# Patient Record
Sex: Male | Born: 1995 | Race: White | Hispanic: No | Marital: Single | Smoking: Never smoker
Health system: Southern US, Community
[De-identification: ages and names within clinical notes are randomized; demographics above are authoritative.]

## PROBLEM LIST (undated history)

## (undated) DIAGNOSIS — J3503 Chronic tonsillitis and adenoiditis: Secondary | ICD-10-CM

## (undated) HISTORY — PX: NO PAST SURGERIES: SHX2092

---
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
## 2017-05-22 HISTORY — PX: WISDOM TOOTH EXTRACTION: SHX21

## 2018-12-04 ENCOUNTER — Ambulatory Visit: Payer: BC Managed Care – PPO | Admitting: Podiatry

## 2018-12-09 ENCOUNTER — Ambulatory Visit (INDEPENDENT_AMBULATORY_CARE_PROVIDER_SITE_OTHER): Payer: BC Managed Care – PPO

## 2018-12-09 ENCOUNTER — Ambulatory Visit: Payer: BC Managed Care – PPO | Admitting: Podiatry

## 2018-12-09 ENCOUNTER — Other Ambulatory Visit: Payer: Self-pay

## 2018-12-09 ENCOUNTER — Encounter: Payer: Self-pay | Admitting: Podiatry

## 2018-12-09 VITALS — BP 115/63 | HR 72 | Temp 98.8°F | Resp 16

## 2018-12-09 DIAGNOSIS — M2142 Flat foot [pes planus] (acquired), left foot: Secondary | ICD-10-CM

## 2018-12-09 DIAGNOSIS — M2141 Flat foot [pes planus] (acquired), right foot: Secondary | ICD-10-CM

## 2018-12-10 ENCOUNTER — Encounter: Payer: Self-pay | Admitting: Podiatry

## 2018-12-10 NOTE — Progress Notes (Signed)
  Subjective:  Patient ID: Ricky Benson, male    DOB: 27-Dec-1995,  MRN: [id number] HPI Chief Complaint  Patient presents with  . Foot Pain    Patient states he feels like his feet roll inward when he walks sometimes causing pain  . New Patient (Initial Visit)    23 y.o. male presents with the above complaint.   ROS: Denies fever chills nausea vomiting muscle aches pains calf pain back pain chest pain shortness of breath.  Past Medical History:  Diagnosis Date  . Chronic tonsillitis and adenoiditis    Past Surgical History:  Procedure Laterality Date  . NO PAST SURGERIES    . TONSILLECTOMY AND ADENOIDECTOMY N/A 05/16/2016   Procedure: TONSILLECTOMY AND ADENOIDECTOMY;  Surgeon: [place] Gust, MD;  Location: [place];  Service: ENT;  Laterality: N/A;    Current Outpatient Medications:  .  DEXILANT 60 MG capsule, , Disp: , Rfl:  .  sucralfate (CARAFATE) 1 g tablet, , Disp: , Rfl:   No Known Allergies Review of Systems Objective:   Vitals:   12/09/18 1421  BP: 115/63  Pulse: 72  Resp: 16  Temp: 98.8 F (37.1 C)    General: Well developed, nourished, in no acute distress, alert and oriented x3   Dermatological: Skin is warm, dry and supple bilateral. Nails x 10 are well maintained; remaining integument appears unremarkable at this time. There are no open sores, no preulcerative lesions, no rash or signs of infection present.  Vascular: Dorsalis Pedis artery and Posterior Tibial artery pedal pulses are 2/4 bilateral with immedate capillary fill time. Pedal hair growth present. No varicosities and no lower extremity edema present bilateral.   Neruologic: Grossly intact via light touch bilateral. Vibratory intact via tuning fork bilateral. Protective threshold with Semmes Wienstein monofilament intact to all pedal sites bilateral. Patellar and Achilles deep tendon reflexes 2+ bilateral. No Babinski or clonus noted bilateral.   Musculoskeletal: No gross boney  pedal deformities bilateral. No pain, crepitus, or limitation noted with foot and ankle range of motion bilateral. Muscular strength 5/5 in all groups tested bilateral.  Flexible pes planus not severe in nature no posterior tibial tendon dysfunction as of yet. Gait: Unassisted, Nonantalgic.    Radiographs:  Radiographs taken today demonstrate no osseous coalitions mild pes planus.  Assessment & Plan:   Assessment: Pes planus  Plan: Scanned for orthotics.     Raigen Jagielski T. [place], [place]

## 2019-01-01 ENCOUNTER — Other Ambulatory Visit: Payer: BC Managed Care – PPO | Admitting: Orthotics

## 2019-01-01 ENCOUNTER — Telehealth: Payer: Self-pay | Admitting: Orthotics

## 2019-01-01 NOTE — Telephone Encounter (Signed)
Called to advised f/o not in.Marland Kitchen

## 2019-05-23 HISTORY — PX: OTHER SURGICAL HISTORY: SHX169

## 2019-10-01 ENCOUNTER — Other Ambulatory Visit: Payer: Self-pay | Admitting: General Surgery

## 2019-10-01 DIAGNOSIS — M6289 Other specified disorders of muscle: Secondary | ICD-10-CM

## 2019-10-01 NOTE — Progress Notes (Signed)
physci

## 2019-10-13 NOTE — Addendum Note (Signed)
Addended byDonnalee Curry on: 10/13/2019 03:08 PM   Modules accepted: Orders

## 2019-12-01 ENCOUNTER — Encounter: Payer: Self-pay | Admitting: Physical Therapy

## 2019-12-01 ENCOUNTER — Ambulatory Visit: Payer: BC Managed Care – PPO | Attending: General Surgery | Admitting: Physical Therapy

## 2019-12-01 ENCOUNTER — Other Ambulatory Visit: Payer: Self-pay

## 2019-12-01 DIAGNOSIS — M545 Low back pain: Secondary | ICD-10-CM | POA: Diagnosis present

## 2019-12-01 DIAGNOSIS — R279 Unspecified lack of coordination: Secondary | ICD-10-CM | POA: Diagnosis present

## 2019-12-01 DIAGNOSIS — M6289 Other specified disorders of muscle: Secondary | ICD-10-CM | POA: Diagnosis present

## 2019-12-01 DIAGNOSIS — G8929 Other chronic pain: Secondary | ICD-10-CM | POA: Diagnosis present

## 2019-12-01 DIAGNOSIS — M4125 Other idiopathic scoliosis, thoracolumbar region: Secondary | ICD-10-CM

## 2019-12-01 DIAGNOSIS — M546 Pain in thoracic spine: Secondary | ICD-10-CM | POA: Insufficient documentation

## 2019-12-02 NOTE — Patient Instructions (Signed)
   Side of hip stretch:  Reclined twist for hips and side of the hips/ legs  Lay on your back, knees bend Scoot hips to the L , leave shoulders in place Rock knees to the R and back  20 reps   __  L trunk rotation during the day

## 2019-12-02 NOTE — Therapy (Addendum)
[facility] MAIN [facility] SERVICES [address] [place], [place], [address] Phone: [phone]   Fax:  [phone]  Physical Therapy Evaluation  Patient Details  Name: Ricky Benson MRN: [id number] Date of Birth: 1995/11/29 Referring Provider (PT): Byrnette    Encounter Date: 12/01/2019    Past Medical History:  Diagnosis Date  . Chronic tonsillitis and adenoiditis     Past Surgical History:  Procedure Laterality Date  .  hip Bilateral 2021   laproscopy for FAI   . NO PAST SURGERIES    . TONSILLECTOMY AND ADENOIDECTOMY N/A 05/16/2016   Procedure: TONSILLECTOMY AND ADENOIDECTOMY;  Surgeon: Linus Salmons, MD;  Location: [facility];  Service: ENT;  Laterality: N/A;  . WISDOM TOOTH EXTRACTION Bilateral 2019    There were no vitals filed for this visit.       Subjective Assessment - 12/01/19 1509    Subjective 1) Digestive issues and clear fecal seepage started in summer 2020. Pt underwent a stressful event with figuring out return to college during the pandemic.   Pt noticed isolated seepage before last summer. Seepage occured after pt was sitting for a while and then it occured with standing. During this period of seepage epsiodes, pt was performing pushups, air squats, band exercises, planks. Pt performed an online exercise program during this time ( "Move[employer]" ) which included abdominal breathing and pt wondered if he had strained.  Caffeine also caused seepage as well. GI tests came back ( colono/endoscopy) with minimal hemorrhage.  Seepage daily  now occurs with sitting for long periods,  sexual activity, caffeine intake.Atletic activities in the past: football ( crunched with R foot back) , track ( R foot dominant to start propulsion) . baseball ( R arm throwing) .            2) CLP  back pain has not change since FAI surgery in 2021. 3-4/10. CLBP: Pt is sitting alot and back pain has not change since surgery. Deep pain 3-4/10  occurs with sitting and movement. The pain is located behind R shoulder blade to below ribcage. Deep pain. Back pain limits his workout routine and no longer is able to weight lifts at his prior level, swim, run. Pt did not have any flexibility routine. Pt has not performed situp and crunches in a long time.     Pertinent History FAI surgery BLE in 2021 and currently with PT in [place] since Feb to current.  Hx of hip and back pain since 18 years.  Pt had Sx from bottom of ribcage to top of hipbone/ QL and anterior thigh/ groin BLE. Pain occured with lifting leg in marching position, biking, and running.  Initially Dx impingement  on R and then L.  Biking is better after surgery and PT. Running is not cleared for yet. Pain now after surgery occurs with sitting and standing for too long ( > ) with inner thigh tightness.  Hx of fall onto tailbone in middle school. Denied injuries to ankle/ knees.    Patient Stated Goals to improve Sx and overall movement and return to activities: exercise, running, lifting, and gain an understanding to return to his pattern              [facility] PT Assessment - 12/02/19 1719      Assessment   Medical Diagnosis pelvic floor dysfunction     Referring Provider (PT) Byrnette       Precautions   Precautions None  Restrictions   Weight Bearing Restrictions No      Balance Screen   Has the patient fallen in the past 6 months No      Observation/Other Assessments   Scoliosis L convex curve upper lumbar, R inferior angle lowered than L  ( Post Tx: shoulders levelled)         Coordination   Gross Motor Movements are Fluid and Coordinated --   simulated breathing technique he used through online program   Coordination and Movement Description simulated breathing technique he used through online program: demo'd overuse of ab mm      Palpation   Spinal mobility deviations see scoliosis section, L rotation limited than R in thoracic rotation (- post Tx:  increased ).  In prone, no spinal deviations), hypomobile L1 deviated to L       SI assessment  levelled iliac crest in standing and supine/ prone. L SIJ hypomobile,                 Objective measurements completed on examination: See above findings.     Pelvic Floor Special Questions - 12/01/19 1550    Diastasis Recti 1 finger width     External Perineal Exam through clothing: L obt int tighter than R                 Objective measurements completed on examination: See above findings.       [facility] Adult PT Treatment/Exercise - 12/08/19 0847      Therapeutic Activites    Therapeutic Activities Other Therapeutic Activities    Other Therapeutic Activities explained etiology of Sx and provided encouragement and anatomy/ physiology explanations       Neuro Re-ed    Neuro Re-ed Details  cued for HEP       Manual Therapy   Manual therapy comments STM/MWM at paraspinals and medial scapula to promote more levelled shoulder                        PT Long Term Goals - 12/01/19 1531      PT LONG TERM GOAL #1   Title Pt will report decreased clear fecal seepage from daily to 3 x week in order to return to functional activities    Time 6    Period Weeks    Status New    Target Date 01/12/20      PT LONG TERM GOAL #2   Title Pt will demo proper deep core coordination  in order straining abdomen and pelvic floor to improve IAP    Time 4    Period Weeks    Status New    Target Date 12/29/19      PT LONG TERM GOAL #3   Title Pt will report decreased LBP on FOTO from 63 pts to < 33 pts in order to return ADLs and fitness    Time 10    Period Weeks    Status New    Target Date 02/10/20      PT LONG TERM GOAL #4   Title Pt will demo no spinal deivation and more levelled shoulder height/ inferior angle in order to minimize LBP and progress to thoracolumbar strengthening exercises    Time 4    Period Weeks    Status New    Target Date 12/30/19      PT  LONG TERM GOAL #5   Title Pt will demo proper coordination of deep core  and strength in order to minimize downward bearing of pelvic floor and minimize  clear fecal seepage    Time 6    Period Weeks    Status New    Target Date 01/13/20                  Plan - 12/08/19 0849    Clinical Impression Statement Pt is a 24 yo who reports digestive issues, clear fecal seepage, and midback and CLBP. These deficits impact his QOL, work, and ADLs.    Pt's musculoskeletal assessment revealed  limited spinal /pelvic mobility, L convex curve at the upper lumbar, lowered R schoulder, dyscoordination and strength of pelvic floor mm, SIJ hypomobility, increased pelvic floor mm tensions,  poor body mechanics which places strain on the abdominal/pelvic floor mm.  These are deficits that indicate an ineffective intraabdominal pressure system associated with increased risk for pt's Sx. Pertinent Hx: FAI surgery, athlete prior to FAI surgery (Athletic activities in the past: football ( crunched with R foot back) , track ( R foot dominant to start propulsion) . baseball ( R arm throwing) .  Pt also tried performing exercises from an online program related to pelvic floor and abdomen. Pt demo'd dyscoordination of deep core system when simulating this exercise.     Pt was provided education on etiology of Sx with anatomy, physiology explanation with images along with the benefits of customized pelvic PT Tx based on pt's medical conditions and musculoskeletal deficits.  Explained the physiology of deep core mm coordination and roles of pelvic floor function in urination, defecation, sexual function, and postural control with deep core mm system. Regional interdependent approaches will yield greater benefits in pt's POC due to the complexity of their past surgical Hx and the significant impact their Sx have had on their QOL.   Following Tx today which pt tolerated without complaints, pt demo'd equal alignment of  shoulders, less spinal deviations, and increased trunk rotation/ sideflexion to L. Plan to progress towards proper deep core technique at next session and address overactive pelvic floor mm.   Pt is currently undergoing PT for his post-FAI surgery at another PT clinic. Pt will begin Pelvic PT here after he gets d/c from other clinic due to limitations of insurance coverage.      Examination-Activity Limitations Stand;Sit    Stability/Clinical Decision Making Evolving/Moderate complexity    Rehab Potential Good    PT Frequency 1x / week    PT Duration --   10   PT Treatment/Interventions Therapeutic activities;Therapeutic exercise;Moist Heat;Balance training;Neuromuscular re-education;Functional mobility training;Stair training;Patient/family education;Manual techniques;Energy conservation    Consulted and Agree with Plan of Care Patient           Patient will benefit from skilled therapeutic intervention in order to improve the following deficits and impairments:  Decreased mobility, Decreased strength, Decreased scar mobility, Hypomobility, Postural dysfunction, Improper body mechanics, Pain, Decreased activity tolerance, Decreased endurance, Decreased safety awareness, Decreased coordination, Difficulty walking, Increased muscle spasms, Decreased balance  Visit Diagnosis: Other idiopathic scoliosis, thoracolumbar region  Unspecified lack of coordination  Chronic bilateral low back pain without sciatica  Pain in thoracic spine     Problem List There are no problems to display for this patient.   Mariane MastersYeung,Shin Yiing  ,PT, DPT, E-RYT   12/08/2019, 8:49 AM  [facility] [address] [place], [place], [address] Phone: [phone]   Fax:  [phone]  Name: Ricky Benson MRN: [id number] Date  of Birth: 05-23-1995

## 2019-12-08 NOTE — Addendum Note (Signed)
Addended by: Mariane Masters on: 12/08/2019 09:00 AM   Modules accepted: Orders

## 2019-12-09 ENCOUNTER — Encounter: Payer: BC Managed Care – PPO | Admitting: Physical Therapy

## 2019-12-15 ENCOUNTER — Encounter: Payer: BC Managed Care – PPO | Admitting: Physical Therapy

## 2019-12-22 ENCOUNTER — Encounter: Payer: BC Managed Care – PPO | Admitting: Physical Therapy

## 2019-12-24 ENCOUNTER — Telehealth: Payer: Self-pay | Admitting: Orthotics

## 2019-12-24 NOTE — Telephone Encounter (Signed)
Called patient to advise he never received his f/o; he said he no longer wanted them and that his foot problem resolved on its own.

## 2019-12-29 ENCOUNTER — Encounter: Payer: BC Managed Care – PPO | Admitting: Physical Therapy

## 2020-01-05 ENCOUNTER — Encounter: Payer: BC Managed Care – PPO | Admitting: Physical Therapy

## 2020-01-12 ENCOUNTER — Encounter: Payer: BC Managed Care – PPO | Admitting: Physical Therapy

## 2020-01-16 ENCOUNTER — Other Ambulatory Visit: Payer: Self-pay

## 2020-01-16 ENCOUNTER — Ambulatory Visit: Payer: BC Managed Care – PPO | Attending: General Surgery | Admitting: Physical Therapy

## 2020-01-16 DIAGNOSIS — M545 Low back pain: Secondary | ICD-10-CM | POA: Diagnosis present

## 2020-01-16 DIAGNOSIS — M6289 Other specified disorders of muscle: Secondary | ICD-10-CM | POA: Diagnosis present

## 2020-01-16 DIAGNOSIS — M4125 Other idiopathic scoliosis, thoracolumbar region: Secondary | ICD-10-CM | POA: Diagnosis not present

## 2020-01-16 DIAGNOSIS — M533 Sacrococcygeal disorders, not elsewhere classified: Secondary | ICD-10-CM | POA: Diagnosis not present

## 2020-01-16 DIAGNOSIS — M546 Pain in thoracic spine: Secondary | ICD-10-CM | POA: Diagnosis present

## 2020-01-16 DIAGNOSIS — R279 Unspecified lack of coordination: Secondary | ICD-10-CM | POA: Insufficient documentation

## 2020-01-16 DIAGNOSIS — G8929 Other chronic pain: Secondary | ICD-10-CM | POA: Diagnosis present

## 2020-01-16 NOTE — Therapy (Addendum)
[facility] MAIN [facility] SERVICES [address] [place], [place], [address] Phone: [phone]   Fax:  [phone]  Physical Therapy Treatment  Patient Details  Name: Ricky Benson MRN: [id number] Date of Birth: 04-05-1996 Referring Provider (PT): Byrnette    Encounter Date: 01/16/2020   PT End of Session - 01/16/20 1228    Visit Number 2    Number of Visits 10    Date for PT Re-Evaluation 02/10/20    PT Start Time 0900    PT Stop Time 1010    PT Time Calculation (min) 70 min    Activity Tolerance Patient tolerated treatment well    Behavior During Therapy [facility] for tasks assessed/performed           Past Medical History:  Diagnosis Date  . Chronic tonsillitis and adenoiditis     Past Surgical History:  Procedure Laterality Date  .  hip Bilateral 2021   laproscopy for FAI   . NO PAST SURGERIES    . TONSILLECTOMY AND ADENOIDECTOMY N/A 05/16/2016   Procedure: TONSILLECTOMY AND ADENOIDECTOMY;  Surgeon: Linus Salmons, MD;  Location: [facility];  Service: ENT;  Laterality: N/A;  . WISDOM TOOTH EXTRACTION Bilateral 2019    There were no vitals filed for this visit.   Subjective Assessment - 01/16/20 0909    Subjective Pt started back with running. Pt is not feeling fluid yet and tender. Pt made a sudden movement and his L hip popped and it startled him. His mind is still trying to be careful if he is healed from surgery. Pt is limiting the variables with social p hysical activities. Pt is conscious is how he is working back to running and lifting.    Pertinent History FAI surgery BLE in 2021 and currently with PT in [place] since Feb to current.  Hx of hip and back pain since 16 years.  Pt had Sx from bottom of ribcage to top of hipbone/ QL and anterior thigh/ groin BLE. Pain occured with lifting leg in marching position, biking, and running.  Initially Dx impingement  on R and then L.  Biking is better after surgery and PT.  Running is not cleared for yet. Pain now after surgery occurs with sitting and standing for too long ( > ) with inner thigh tightness.  Hx of fall onto tailbone in middle school. Denied injuries to ankle/ knees.    Patient Stated Goals to improve Sx and overall movement and return to activities: exercise, running, lifting, and gain an understanding to return to his pattern             Objective:  noted upper trap overuse, slumped posture   deeper breaths with exhalation/ lowering of thorax   abdominal strain with exhalation    Tx: therapeutic activity and neuromuscular re-education  Assessment: Pt required biopsychosocial approaches as pt expressed how his x have impacted his QOL, expressed his concerns about his Sx and etiology and reintegration to fitness/ weight training. Pt was provided active listening, explanations with anatomy/physiology images, role of nn system on mm tensions and coordination.  Education was focused to help pt feel more reassured and confident with his healing and pelvic floor function. Pt reported feeling more relaxed with mindfulness and body scan practice.    Plan: Assess pelvic floor and progress with deep core coordination and strengthening at next session.  PT Long Term Goals - 12/01/19 1531      PT LONG TERM GOAL #1   Title Pt will report decreased clear fecal seepage from daily to 3 x week in order to return to functional activities    Time 6    Period Weeks    Status New    Target Date 01/12/20      PT LONG TERM GOAL #2   Title Pt will demo proper deep core coordination  in order straining abdomen and pelvic floor to improve IAP    Time 4    Period Weeks    Status New    Target Date 12/29/19      PT LONG TERM GOAL #3   Title Pt will report decreased LBP on FOTO from 63 pts to < 33 pts in order to return ADLs and fitness    Time 10    Period Weeks    Status New    Target Date  02/10/20      PT LONG TERM GOAL #4   Title Pt will demo no spinal deivation and more levelled shoulder height/ inferior angle in order to minimize LBP and progress to thoracolumbar strengthening exercises    Time 4    Period Weeks    Status New    Target Date 12/30/19      PT LONG TERM GOAL #5   Title Pt will demo proper coordination of deep core and strength in order to minimize downward bearing of pelvic floor and minimize  clear fecal seepage    Time 6    Period Weeks    Status New    Target Date 01/13/20                 Plan - 01/16/20 1229        Examination-Activity Limitations Stand;Sit    Stability/Clinical Decision Making Evolving/Moderate complexity    Rehab Potential Good    PT Frequency 1x / week    PT Duration --   10   PT Treatment/Interventions Therapeutic activities;Therapeutic exercise;Moist Heat;Balance training;Neuromuscular re-education;Functional mobility training;Stair training;Patient/family education;Manual techniques;Energy conservation    Consulted and Agree with Plan of Care Patient           Patient will benefit from skilled therapeutic intervention in order to improve the following deficits and impairments:  Decreased mobility, Decreased strength, Decreased scar mobility, Hypomobility, Postural dysfunction, Improper body mechanics, Pain, Decreased activity tolerance, Decreased endurance, Decreased safety awareness, Decreased coordination, Difficulty walking, Increased muscle spasms, Decreased balance  Visit Diagnosis: Other idiopathic scoliosis, thoracolumbar region  Unspecified lack of coordination  Chronic bilateral low back pain without sciatica  Pain in thoracic spine  Pelvic floor dysfunction     Problem List There are no problems to display for this patient.   Mariane Masters ,PT, DPT, E-RYT  01/16/2020, 12:30 PM  [facility] MAIN [facility] SERVICES [address] [place], [place],  [address] Phone: [phone]   Fax:  [phone]  Name: Ricky Benson MRN: [id number] Date of Birth: 11/10/1995

## 2020-01-16 NOTE — Patient Instructions (Addendum)
Work with a Airline pilot  ____   Avoid straining pelvic floor, abdominal muscles , spine  Use log rolling technique instead of getting out of bed with your neck or the sit-up     Log rolling into and out of bed   Log rolling into and out of bed If getting out of bed on R side, Bent knees, scoot hips/ shoulder to L  Raise R arm completely overhead, rolling onto armpit  Then lower bent knees to bed to get into complete side lying position  Then drop legs off bed, and push up onto R elbow/forearm, and use L hand to push onto the bed  ___   Body scan once a day, legs propped up over arm of couch or over pillows which allows for circulation,  Can use eye pillow or towel to cover eyes ( deeper relaxation)  See email for audio recording   ___

## 2020-01-19 ENCOUNTER — Encounter: Payer: BC Managed Care – PPO | Admitting: Physical Therapy

## 2020-01-22 ENCOUNTER — Other Ambulatory Visit: Payer: Self-pay

## 2020-01-22 ENCOUNTER — Ambulatory Visit: Payer: BC Managed Care – PPO | Attending: General Surgery | Admitting: Physical Therapy

## 2020-01-22 DIAGNOSIS — M546 Pain in thoracic spine: Secondary | ICD-10-CM | POA: Diagnosis present

## 2020-01-22 DIAGNOSIS — M6289 Other specified disorders of muscle: Secondary | ICD-10-CM | POA: Diagnosis present

## 2020-01-22 DIAGNOSIS — M533 Sacrococcygeal disorders, not elsewhere classified: Secondary | ICD-10-CM | POA: Insufficient documentation

## 2020-01-22 DIAGNOSIS — R279 Unspecified lack of coordination: Secondary | ICD-10-CM | POA: Insufficient documentation

## 2020-01-22 DIAGNOSIS — M4125 Other idiopathic scoliosis, thoracolumbar region: Secondary | ICD-10-CM | POA: Insufficient documentation

## 2020-01-22 DIAGNOSIS — M545 Low back pain: Secondary | ICD-10-CM | POA: Insufficient documentation

## 2020-01-22 DIAGNOSIS — G8929 Other chronic pain: Secondary | ICD-10-CM

## 2020-01-22 NOTE — Therapy (Addendum)
[facility] MAIN [facility] SERVICES [address] [place], [place], [address] Phone: [phone]   Fax:  [phone]  Physical Therapy Treatment  Patient Details  Name: Ricky Benson MRN: [id number] Date of Birth: 1995-08-22 Referring Provider (PT): Byrnette    Encounter Date: 01/22/2020   PT End of Session - 01/22/20 1657    Visit Number 3    Number of Visits 10    Date for PT Re-Evaluation 02/10/20    PT Start Time 1504    PT Stop Time 1600    PT Time Calculation (min) 56 min    Activity Tolerance Patient tolerated treatment well    Behavior During Therapy [facility] for tasks assessed/performed           Past Medical History:  Diagnosis Date  . Chronic tonsillitis and adenoiditis     Past Surgical History:  Procedure Laterality Date  .  hip Bilateral 2021   laproscopy for FAI   . NO PAST SURGERIES    . TONSILLECTOMY AND ADENOIDECTOMY N/A 05/16/2016   Procedure: TONSILLECTOMY AND ADENOIDECTOMY;  Surgeon: Linus Salmons, MD;  Location: [facility];  Service: ENT;  Laterality: N/A;  . WISDOM TOOTH EXTRACTION Bilateral 2019    There were no vitals filed for this visit.   Subjective Assessment - 01/22/20 1507    Subjective Pt reported he has been busy and had not had a chance to try the mindfulness technique. Pt reported feeling good after last session. Pt appreciated being called by PT to check in. Pt remains optimistic. Pt has returned to interval runs and notices a clicking in his L shoulder.    Pertinent History FAI surgery BLE in 2021 and currently with PT in [place] since Feb to current.  Hx of hip and back pain since 18 years.  Pt had Sx from bottom of ribcage to top of hipbone/ QL and anterior thigh/ groin BLE. Pain occured with lifting leg in marching position, biking, and running.  Initially Dx impingement  on R and then L.  Biking is better after surgery and PT. Running is not cleared for yet. Pain now after surgery occurs  with sitting and standing for too long ( > ) with inner thigh tightness.  Hx of fall onto tailbone in middle school. Denied injuries to ankle/ knees.    Patient Stated Goals to improve Sx and overall movement and return to activities: exercise, running, lifting, and gain an understanding to return to his pattern              [facility] PT Assessment - 01/22/20 1511      Observation/Other Assessments   Scoliosis T5 convex curve, L shoulder is lower,  ( Post Tx: shoulders levelled, less T5 concave curve)        Coordination   Coordination and Movement Description abdominal straining , limited diaphragmatic breathing       Palpation   Spinal mobility tightness along paraspinal, medial scap, scalenes L                        Pelvic Floor Special Questions - 01/22/20 1551    Diastasis Recti 2 fingers width along linea alba              [facility] Adult PT Treatment/Exercise - 01/22/20 1657      Neuro Re-ed    Neuro Re-ed Details  cued for deepc ore coordination and strength , new HEP . cued for  less ab overuse       Manual Therapy   Manual therapy comments STM/MWM paraspinal, medial scap, scalenes L  , PA mob                          PT Long Term Goals - 12/01/19 1531      PT LONG TERM GOAL #1   Title Pt will report decreased clear fecal seepage from daily to 3 x week in order to return to functional activities    Time 6    Period Weeks    Status New    Target Date 01/12/20      PT LONG TERM GOAL #2   Title Pt will demo proper deep core coordination  in order straining abdomen and pelvic floor to improve IAP    Time 4    Period Weeks    Status New    Target Date 12/29/19      PT LONG TERM GOAL #3   Title Pt will report decreased LBP on FOTO from 63 pts to < 33 pts in order to return ADLs and fitness    Time 10    Period Weeks    Status New    Target Date 02/10/20      PT LONG TERM GOAL #4   Title Pt will demo no spinal deivation and more  levelled shoulder height/ inferior angle in order to minimize LBP and progress to thoracolumbar strengthening exercises    Time 4    Period Weeks    Status New    Target Date 12/30/19      PT LONG TERM GOAL #5   Title Pt will demo proper coordination of deep core and strength in order to minimize downward bearing of pelvic floor and minimize  clear fecal seepage    Time 6    Period Weeks    Status New    Target Date 01/13/20                 Plan - 01/22/20 1657    Clinical Impression Statement Pt demo'd more levelled shoulder height, less T spine convex curve, decreased medial scapula mm tightness post Tx. Pt demo'd improved deep core coordination with less abdominal straining and more diaphragmatic excursion. Pt required cues with training. Plan to address DRA at next session.  Pt continues to benefit from skilled PT.    Examination-Activity Limitations Stand;Sit    Stability/Clinical Decision Making Evolving/Moderate complexity    Rehab Potential Good    PT Frequency 1x / week    PT Duration --   10   PT Treatment/Interventions Therapeutic activities;Therapeutic exercise;Moist Heat;Balance training;Neuromuscular re-education;Functional mobility training;Stair training;Patient/family education;Manual techniques;Energy conservation    Consulted and Agree with Plan of Care Patient           Patient will benefit from skilled therapeutic intervention in order to improve the following deficits and impairments:  Decreased mobility, Decreased strength, Decreased scar mobility, Hypomobility, Postural dysfunction, Improper body mechanics, Pain, Decreased activity tolerance, Decreased endurance, Decreased safety awareness, Decreased coordination, Difficulty walking, Increased muscle spasms, Decreased balance  Visit Diagnosis: Other idiopathic scoliosis, thoracolumbar region  Unspecified lack of coordination  Chronic bilateral low back pain without sciatica  Pain in thoracic  spine  Pelvic floor dysfunction     Problem List There are no problems to display for this patient.   Mariane Masters ,PT, DPT, E-RYT  01/22/2020, 6:07 PM  [place] [facility] MAIN [facility] SERVICES [address] [place], [place], [address] Phone: [phone]   Fax:  [phone]  Name: Ricky Benson MRN: [id number] Date of Birth: 1996-05-08

## 2020-01-22 NOTE — Patient Instructions (Signed)
Lengthen Back rib by _ shoulder    Lie on   side , pillow between knees  Pull  arm overhead over mattress, grab the edge of mattress,pull it upward, drawing elbow away from ears  Breathing  10 reps   Open book (handout)  Lying on  _ side , rotating  __ only this week  15 reps  BOTH SIDES   ____   Deep core level 1 and 2 ( handout) 2 x day   ____   Study stretches at school     At desk   A) Pull buttocks back to lengthen spine, knees bent  3 breaths   B) Bring R hand to the L, and stretch the R side trunk  3 breaths   Brings hands to center again Do the same to the L side stretch by placing L hand on top of R   D) Modified thread the needle R hand on L thigh, L  thigh pushing out slightly as the R hands pull in,  elbow bent and pulls to theR,  Look under L armpit   Do the same to other side  ____  Seated twist   ____  Richardson Dopp stretch at the doorway ( behind shoulder blade)

## 2020-01-30 ENCOUNTER — Ambulatory Visit: Payer: BC Managed Care – PPO | Admitting: Physical Therapy

## 2020-02-02 ENCOUNTER — Ambulatory Visit: Payer: BC Managed Care – PPO | Admitting: Physical Therapy

## 2020-02-02 ENCOUNTER — Encounter: Payer: BC Managed Care – PPO | Admitting: Physical Therapy

## 2020-02-02 ENCOUNTER — Other Ambulatory Visit: Payer: Self-pay

## 2020-02-02 DIAGNOSIS — M6289 Other specified disorders of muscle: Secondary | ICD-10-CM

## 2020-02-02 DIAGNOSIS — M4125 Other idiopathic scoliosis, thoracolumbar region: Secondary | ICD-10-CM | POA: Diagnosis not present

## 2020-02-02 DIAGNOSIS — M545 Low back pain, unspecified: Secondary | ICD-10-CM

## 2020-02-02 DIAGNOSIS — M546 Pain in thoracic spine: Secondary | ICD-10-CM

## 2020-02-02 DIAGNOSIS — R279 Unspecified lack of coordination: Secondary | ICD-10-CM

## 2020-02-02 NOTE — Therapy (Signed)
[place] [facility] MAIN [facility] SERVICES [address] [place], [place], [address] Phone: [phone]   Fax:  [phone]  Physical Therapy Treatment  Patient Details  Name: Ricky Benson MRN: [id number] Date of Birth: Feb 09, 1996 Referring Provider (PT): Byrnette    Encounter Date: 02/02/2020   PT End of Session - 02/02/20 1009    Visit Number 4    Number of Visits 10    Date for PT Re-Evaluation 02/10/20    PT Start Time 0903    PT Stop Time 1004    PT Time Calculation (min) 61 min    Activity Tolerance Patient tolerated treatment well    Behavior During Therapy [facility] for tasks assessed/performed           Past Medical History:  Diagnosis Date  . Chronic tonsillitis and adenoiditis     Past Surgical History:  Procedure Laterality Date  .  hip Bilateral 2021   laproscopy for FAI   . NO PAST SURGERIES    . TONSILLECTOMY AND ADENOIDECTOMY N/A 05/16/2016   Procedure: TONSILLECTOMY AND ADENOIDECTOMY;  Surgeon: Linus Salmons, MD;  Location: [facility];  Service: ENT;  Laterality: N/A;  . WISDOM TOOTH EXTRACTION Bilateral 2019    There were no vitals filed for this visit.   Subjective Assessment - 02/02/20 0905    Subjective Pt felt good after last session and the breathing practice has been beneficial. Pt feels he is moving in a general direction.  Pt is feeling this is only going to make him stronger. Pt is working on trusting that there is hope to his symptoms getting better. Pt had felt hopeless when with his first appt. Pt knows he has ways to go.    Pertinent History FAI surgery BLE in 2021 and currently with PT in [place] since Feb to current.  Hx of hip and back pain since 18 years.  Pt had Sx from bottom of ribcage to top of hipbone/ QL and anterior thigh/ groin BLE. Pain occured with lifting leg in marching position, biking, and running.  Initially Dx impingement  on R and then L.  Biking is better after surgery and PT.  Running is not cleared for yet. Pain now after surgery occurs with sitting and standing for too long ( > ) with inner thigh tightness.  Hx of fall onto tailbone in middle school. Denied injuries to ankle/ knees.    Patient Stated Goals to improve Sx and overall movement and return to activities: exercise, running, lifting, and gain an understanding to return to his pattern              [facility] PT Assessment - 02/02/20 0931      Observation/Other Assessments   Scoliosis no T5 convex curve, L shoulder slightly  lowered , but more levelled compared to last week         Coordination   Coordination and Movement Description overuse of ab with deep core       Palpation   SI assessment  levelled iliac crest, signficantly less scoliosis deviations, shoulders levelled                       Pelvic Floor Special Questions - 02/02/20 0952    Diastasis Recti no fingers width      External Perineal Exam through clothing: tightness no tenderness: bulbospongiosus, ischiocavernosus B  , deep transverse perineal mm  [facility] Adult PT Treatment/Exercise - 02/02/20 0953      Therapeutic Activites    Other Therapeutic Activities biopsychosocial approaches , active listneing, co-creation of strategies, explained the approaches to yield better approaches,       Neuro Re-ed    Neuro Re-ed Details  deep core coordination  with less ab straining, relaxation with points of contact       Manual Therapy   Manual therapy comments STM/MWM : bulbospongiosus, ischiocavernosus B  , deep transverse perineal mm                       PT Long Term Goals - 12/01/19 1531      PT LONG TERM GOAL #1   Title Pt will report decreased clear fecal seepage from daily to 3 x week in order to return to functional activities    Time 6    Period Weeks    Status New    Target Date 01/12/20      PT LONG TERM GOAL #2   Title Pt will demo proper deep core coordination  in order  straining abdomen and pelvic floor to improve IAP    Time 4    Period Weeks    Status New    Target Date 12/29/19      PT LONG TERM GOAL #3   Title Pt will report decreased LBP on FOTO from 63 pts to < 33 pts in order to return ADLs and fitness    Time 10    Period Weeks    Status New    Target Date 02/10/20      PT LONG TERM GOAL #4   Title Pt will demo no spinal deivation and more levelled shoulder height/ inferior angle in order to minimize LBP and progress to thoracolumbar strengthening exercises    Time 4    Period Weeks    Status New    Target Date 12/30/19      PT LONG TERM GOAL #5   Title Pt will demo proper coordination of deep core and strength in order to minimize downward bearing of pelvic floor and minimize  clear fecal seepage    Time 6    Period Weeks    Status New    Target Date 01/13/20                 Plan - 02/02/20 1010    Clinical Impression Statement Pt demo'd significantly less spinal deviations, more levelled shoulder height, equal pelvic girdle height, and resolved diastasis recti.  Pt demo'd increased anterior pelvic floor tightness which decreased post training and Tx. Pt required cues for less ab straining with deep core coordination. Pt required biopsychotherapy approaches, active listening, and referral to psychotherapy services at his college to build morale, optimism, and self-confidence with improvement of Sx and improved QOL. Pt demo'd new HEP to minimize pelvic floor tightness and proper deep core coordination. Pt continues to benefit from skilled PT.    Examination-Activity Limitations Stand;Sit    Stability/Clinical Decision Making Evolving/Moderate complexity    Rehab Potential Good    PT Frequency 1x / week    PT Duration --   10   PT Treatment/Interventions Therapeutic activities;Therapeutic exercise;Moist Heat;Balance training;Neuromuscular re-education;Functional mobility training;Stair training;Patient/family education;Manual  techniques;Energy conservation    Consulted and Agree with Plan of Care Patient           Patient will benefit from skilled therapeutic intervention in order to improve the following  deficits and impairments:  Decreased mobility, Decreased strength, Decreased scar mobility, Hypomobility, Postural dysfunction, Improper body mechanics, Pain, Decreased activity tolerance, Decreased endurance, Decreased safety awareness, Decreased coordination, Difficulty walking, Increased muscle spasms, Decreased balance  Visit Diagnosis: Unspecified lack of coordination  Chronic bilateral low back pain without sciatica  Pain in thoracic spine  Pelvic floor dysfunction     Problem List There are no problems to display for this patient.   Mariane Masters ,PT, DPT, E-RYT  02/02/2020, 10:12 AM  [place] [facility] MAIN [facility] SERVICES [address] [place], [place], [address] Phone: [phone]   Fax:  [phone]  Name: Ricky Benson MRN: [id number] Date of Birth: 04/11/1996

## 2020-02-02 NOTE — Patient Instructions (Addendum)
Positive feedback loop practices:   gratitude journal  reflections of feelings after performing body scan   drawing   ____  Stretch for pelvic floor   V- slides  "v heels slide away and then back toward buttocks and then rock knee to slight ,  slide heel along at 11 o clock away from buttocks   10 reps    ____ Refining deep core practice:  Soft sound One hand on top belly, one hand at low belly    Practice proper pelvic floor coordination  Inhale: expand pelvic floor muscles Exhale" "j" scoop, allow pelvic floor to close, lift first before belly sinks   ( not "draw abdominal muscle to spine" or strain with abdominal muscles")   ____  Add psychotherapy at school to help with your symptoms from a biopsychotherapist   Darel Hong, LCSW  [facility] ([email or web address]  Martyn Ehrich, LCSW  [facility] ([email or web address]  Charlynne Pander, Ph.D.  [facility] ([email or web address]  Ilene Qua  [facility] ([email or web address]    When finding a psychotherapist,  See if they have these   Somatic therapy EMDR Emotional freedom tapping techniques

## 2020-02-09 ENCOUNTER — Encounter: Payer: BC Managed Care – PPO | Admitting: Physical Therapy

## 2020-02-09 ENCOUNTER — Ambulatory Visit: Payer: BC Managed Care – PPO | Admitting: Physical Therapy

## 2020-02-09 ENCOUNTER — Other Ambulatory Visit: Payer: Self-pay

## 2020-02-09 DIAGNOSIS — M4125 Other idiopathic scoliosis, thoracolumbar region: Secondary | ICD-10-CM | POA: Diagnosis not present

## 2020-02-09 DIAGNOSIS — M545 Low back pain, unspecified: Secondary | ICD-10-CM

## 2020-02-09 DIAGNOSIS — M546 Pain in thoracic spine: Secondary | ICD-10-CM

## 2020-02-09 DIAGNOSIS — R279 Unspecified lack of coordination: Secondary | ICD-10-CM

## 2020-02-09 DIAGNOSIS — M6289 Other specified disorders of muscle: Secondary | ICD-10-CM

## 2020-02-09 NOTE — Therapy (Signed)
[place] [facility] MAIN [facility] SERVICES [address] [place], [place], [address] Phone: [phone]   Fax:  [phone]  Physical Therapy Treatment  Patient Details  Name: Ricky Benson MRN: [id number] Date of Birth: [birth year] Referring Provider (PT): Byrnette    Encounter Date: 02/09/2020   PT End of Session - 02/09/20 0910    Visit Number 5    Number of Visits 10    Date for PT Re-Evaluation 02/10/20    PT Start Time 0903    PT Stop Time 1000    PT Time Calculation (min) 57 min    Activity Tolerance Patient tolerated treatment well    Behavior During Therapy [facility] for tasks assessed/performed           Past Medical History:  Diagnosis Date  . Chronic tonsillitis and adenoiditis     Past Surgical History:  Procedure Laterality Date  .  hip Bilateral 2021   laproscopy for FAI   . NO PAST SURGERIES    . TONSILLECTOMY AND ADENOIDECTOMY N/A 05/16/2016   Procedure: TONSILLECTOMY AND ADENOIDECTOMY;  Surgeon: Linus Salmons, MD;  Location: [facility];  Service: ENT;  Laterality: N/A;  . WISDOM TOOTH EXTRACTION Bilateral 2019    There were no vitals filed for this visit.   Subjective Assessment - 02/09/20 0906    Subjective Pt reported he noticed his Sx less during the weekend with family and fun activities. He feels his mental states is in a moer solid place and he also recognizes he has room for improvements. Pt has not had time to do the search for a psychotherapist and plans to do that soon.    Pertinent History FAI surgery BLE in 2021 and currently with PT in [place] since Feb to current.  Hx of hip and back pain since 18 years.  Pt had Sx from bottom of ribcage to top of hipbone/ QL and anterior thigh/ groin BLE. Pain occured with lifting leg in marching position, biking, and running.  Initially Dx impingement  on R and then L.  Biking is better after surgery and PT. Running is not cleared for yet. Pain now after surgery occurs  with sitting and standing for too long ( > ) with inner thigh tightness.  Hx of fall onto tailbone in middle school. Denied injuries to ankle/ knees.    Patient Stated Goals to improve Sx and overall movement and return to activities: exercise, running, lifting, and gain an understanding to return to his pattern              [facility] PT Assessment - 02/09/20 8416      Other:   Other/ Comments RDL with hip IR, knee addcution, pronation at ankles B  , LOB       Strength   Overall Strength Comments PF 4/5 L 13 reps R, 4 reps on L  without UE support   Hip abd 5/5  B      Palpation   Palpation comment tightness at plantar fascia, limited toe abduction, limited DF       Ambulation/Gait   Gait Comments pronation, short strides, minimal hip flexion                          [facility] Adult PT Treatment/Exercise - 02/09/20 6063      Therapeutic Activites    Other Therapeutic Activities explained role of lower kinetic chain with pelvic floor overactivity, need to co-activate  transverse arch and plantar arch with jogging with less risk for hip injuries       Neuro Re-ed    Neuro Re-ed Details  excessive cues for transverse arch, less pronation in SLS heel raises and RDL with tactile cues  use of bands       Manual Therapy   Manual therapy comments B distraction at talocrural joints, AP/PA along rays to promote balance between supination/ pronation, STM at transverse head of hallus adductor mm, along pereoneal longus, AP/ superior mob at fibula head to promote DF/EV and tow abduction/ higher arches                        PT Long Term Goals - 12/01/19 1531      PT LONG TERM GOAL #1   Title Pt will report decreased clear fecal seepage from daily to 3 x week in order to return to functional activities    Time 6    Period Weeks    Status New    Target Date 01/12/20      PT LONG TERM GOAL #2   Title Pt will demo proper deep core coordination  in order  straining abdomen and pelvic floor to improve IAP    Time 4    Period Weeks    Status New    Target Date 12/29/19      PT LONG TERM GOAL #3   Title Pt will report decreased LBP on FOTO from 63 pts to < 33 pts in order to return ADLs and fitness    Time 10    Period Weeks    Status New    Target Date 02/10/20      PT LONG TERM GOAL #4   Title Pt will demo no spinal deivation and more levelled shoulder height/ inferior angle in order to minimize LBP and progress to thoracolumbar strengthening exercises    Time 4    Period Weeks    Status New    Target Date 12/30/19      PT LONG TERM GOAL #5   Title Pt will demo proper coordination of deep core and strength in order to minimize downward bearing of pelvic floor and minimize  clear fecal seepage    Time 6    Period Weeks    Status New    Target Date 01/13/20                 Plan - 02/09/20 0910    Clinical Impression Statement Pt required active listening and strategies to build compliance with HEP due to pt's busy college schedule Assessed one exercise pt had been doing as prescribed by other PT who helped pt s/p FAI surgery. Pt demo'd poor balance and hip IR/ pronated. Pt also showed weak PF strength in standing without UE support.  Assessed lower kinetic chain and demo'd limited DF/hypomobile midfoot which is associated with pt's short strides and poor propulsion. Pt's hip abd strength was strong so more focus will be given at lower kinetic chain. Plan to prep pt to return to jogging safely, pt will require more Tx from a regional interdependent approach with more ankle/ foot strengthening./  Provided excessive cues for propioception for knee alignment to minimize hip IR/ adduction to preserve integrity of FAI surgeries BLE. Decreased pt's current frequency of jog/ walk on pavement from 3 x to 2 x week and discussed the use of mini trampoline and BOSU to build plantar strength an  correct pronation to minimize injuries. Discussed  connection of overactive pelvic floor with lower kinetic chain deficits. Asked pt to bring in his workout routine next session to improve propioception and technique to minimize overactivity of pelvic floor and minimize injuries.   Pt continues to benefit from skilled PT    Examination-Activity Limitations Stand;Sit    Stability/Clinical Decision Making Evolving/Moderate complexity    Rehab Potential Good    PT Frequency 1x / week    PT Duration --   10   PT Treatment/Interventions Therapeutic activities;Therapeutic exercise;Moist Heat;Balance training;Neuromuscular re-education;Functional mobility training;Stair training;Patient/family education;Manual techniques;Energy conservation    Consulted and Agree with Plan of Care Patient           Patient will benefit from skilled therapeutic intervention in order to improve the following deficits and impairments:  Decreased mobility, Decreased strength, Decreased scar mobility, Hypomobility, Postural dysfunction, Improper body mechanics, Pain, Decreased activity tolerance, Decreased endurance, Decreased safety awareness, Decreased coordination, Difficulty walking, Increased muscle spasms, Decreased balance  Visit Diagnosis: Unspecified lack of coordination  Chronic bilateral low back pain without sciatica  Pain in thoracic spine  Pelvic floor dysfunction     Problem List There are no problems to display for this patient.   Mariane Masters ,PT, DPT, E-RYT  02/09/2020, 11:55 AM  [place] [facility] MAIN [facility] SERVICES [address] [place], [place], [address] Phone: [phone]   Fax:  [phone]  Name: Ricky Benson MRN: [id number] Date of Birth: May 19, 1996

## 2020-02-09 NOTE — Patient Instructions (Addendum)
Strategy for compliance with deep core level 1 and 2: Making time after showering and before bed is what you stated would be the best time   Body scan as cool down after workout   ___  Lower kinetic chain strengthening and minimizing risk for hip injuries and prep for running     Feet slides :   Points of contact at sitting bones  Four points of contact of foot, Heel up, ankle not twist out Lower heel Four points of contact of foot, Slide foot back   Repeated with other foot    10 reps    ____         Letter T, [place]aw on one leg with band   - RDL   band under L foot, wrap by big toe then, outer knee/ thigh, L hand pulling ( elbow by side)  Plant ballmound, toes spread, thigh out against band,, tracking knee in line with 2-3rd toe line Dipping forward, R foot lifts slight ( whole body like a see saw) off floor or  Press back foot against wall 20 reps  Both     __   Calf stretch in lunge position to increase dorsiflexion   __  provide schedule with workout routine   decrease jogging from 3 x to 2 x a week this week while you focus on improving feet and knees   buying mini trampoline , BOSU ball

## 2020-02-16 ENCOUNTER — Encounter: Payer: BC Managed Care – PPO | Admitting: Physical Therapy

## 2020-02-16 ENCOUNTER — Ambulatory Visit: Payer: BC Managed Care – PPO | Admitting: Physical Therapy

## 2020-02-16 ENCOUNTER — Other Ambulatory Visit: Payer: Self-pay

## 2020-02-16 DIAGNOSIS — M6289 Other specified disorders of muscle: Secondary | ICD-10-CM

## 2020-02-16 DIAGNOSIS — M4125 Other idiopathic scoliosis, thoracolumbar region: Secondary | ICD-10-CM | POA: Diagnosis not present

## 2020-02-16 DIAGNOSIS — G8929 Other chronic pain: Secondary | ICD-10-CM

## 2020-02-16 DIAGNOSIS — R279 Unspecified lack of coordination: Secondary | ICD-10-CM

## 2020-02-16 DIAGNOSIS — M546 Pain in thoracic spine: Secondary | ICD-10-CM

## 2020-02-16 NOTE — Patient Instructions (Addendum)
Stretch for pelvic floor   V- slides  "v heels slide away and then back toward buttocks and then rock knee to slight ,  slide heel along at 11 o clock away from buttocks   10 reps      On belly: Riding horse edge of mattress  knee bent like riding a horse, move knee towards armpit and out  10 reps   Single knee to chest circles    Happy Baby      Childs pose rocking

## 2020-02-16 NOTE — Therapy (Signed)
[facility] MAIN [facility] SERVICES [address] [place], [place], [address] Phone: [phone]   Fax:  [phone]  Physical Therapy Treatment / Progress Note   Patient Details  Name: Ricky Benson MRN: [id number] Date of Birth: 02-22-1996 Referring Provider (PT): Byrnette    Encounter Date: 02/16/2020   PT End of Session - 02/16/20 1003    Visit Number 6    Date for PT Re-Evaluation 04/26/20    PT Start Time 0906    PT Stop Time 1005    PT Time Calculation (min) 59 min    Activity Tolerance Patient tolerated treatment well    Behavior During Therapy [facility] for tasks assessed/performed           Past Medical History:  Diagnosis Date  . Chronic tonsillitis and adenoiditis     Past Surgical History:  Procedure Laterality Date  .  hip Bilateral 2021   laproscopy for FAI   . NO PAST SURGERIES    . TONSILLECTOMY AND ADENOIDECTOMY N/A 05/16/2016   Procedure: TONSILLECTOMY AND ADENOIDECTOMY;  Surgeon: Linus Salmons, MD;  Location: [facility];  Service: ENT;  Laterality: N/A;  . WISDOM TOOTH EXTRACTION Bilateral 2019    There were no vitals filed for this visit.   Subjective Assessment - 02/16/20 0909    Subjective Pt notices there are times when he is real leaky. There are times, he feels he passes gas to relief a swelling feeling and sometimes when he does pass gas, the swelling feeling makes it harder to pass gas. Pt has to exert to push to pass gas . Pt went to several social events and the Sx did not interfere with him.  Pt plans to do the body scan and deep core this week. Pt did do the [facility] exercise and he felt it was very helpful.    Pertinent History FAI surgery BLE in 2021 and currently with PT in [place] since Feb to current.  Hx of hip and back pain since 18 years.  Pt had Sx from bottom of ribcage to top of hipbone/ QL and anterior thigh/ groin BLE. Pain occured with lifting leg in marching position, biking,  and running.  Initially Dx impingement  on R and then L.  Biking is better after surgery and PT. Running is not cleared for yet. Pain now after surgery occurs with sitting and standing for too long ( > ) with inner thigh tightness.  Hx of fall onto tailbone in middle school. Denied injuries to ankle/ knees.    Patient Stated Goals to improve Sx and overall movement and return to activities: exercise, running, lifting, and gain an understanding to return to his pattern                          Pelvic Floor Special Questions - 02/16/20 0957    External Perineal Exam without clothing, pt consented without contraindications     External Palpation tightness obt int B, bulbospongiosus/ ischiocavernosus B     Pelvic Floor Internal Exam pt consented without contraindications     Exam Type Rectal    Palpation tightness/ tenderness 1-6 oclock at EAS and 3rd layer , cued for pelvic tilt anteriorly to relax and use less ab mm              [facility] Adult PT Treatment/Exercise - 02/16/20 7591      Neuro Re-ed    Neuro Re-ed Details  cued for pelvic tilt anteriorly to relax and use less ab mm      Manual Therapy   Internal Pelvic Floor rectal: 1-6 o clock  areas EAS- 3rd layer                        PT Long Term Goals - 02/16/20 1010      PT LONG TERM GOAL #1   Title Pt will report decreased clear fecal seepage from daily to 3 x week in order to return to functional activities    Time 6    Period Weeks    Status On-going      PT LONG TERM GOAL #2   Title Pt will demo proper deep core coordination  in order straining abdomen and pelvic floor to improve IAP    Time 4    Period Weeks    Status Achieved      PT LONG TERM GOAL #3   Title Pt will report decreased LBP on FOTO from 63 pts to < 93 pts in order to return ADLs and fitness ( 9/27: 94 pts)    Time 10    Period Weeks    Status Achieved      PT LONG TERM GOAL #4   Title Pt will demo no spinal deivation  and more levelled shoulder height/ inferior angle in order to minimize LBP and progress to thoracolumbar strengthening exercises    Time 4    Period Weeks    Status Achieved      PT LONG TERM GOAL #5   Title Pt will demo proper coordination of deep core and strength in order to minimize downward bearing of pelvic floor and minimize  clear fecal seepage    Time 6    Period Weeks    Status On-going      Additional Long Term Goals   Additional Long Term Goals Yes      PT LONG TERM GOAL #6   Title Pt will demo no pelvic floor mm tightness and demo proper lengthening in order to minimize leakage    Time 10    Period Weeks    Status New    Target Date 04/26/20      PT LONG TERM GOAL #7   Title Pt will be IND with modifications to fitness routine at the gym in order to minimize bearing down of pelvic floor and no ab overuse to minimize risk for leakage and    Time 10    Period Weeks    Status New    Target Date 04/26/20                 Plan - 02/16/20 1140    Clinical Impression Statement Pt has achieved 3/6 goals across the past 6 visits. Pt's diastasis recti, spinal deviations, pelvic girdle misalignment have resolved which helps to improve his intraabdominal pressure system.  Pt's LBP have improved significantly with his increased score on FOTO from 63 to 94 pts. Currently working on decreasing pelvic floor mm tightness and improving pelvic floor and deep core mm coordination. Pt continues to require modifications to his previous PT exercises s/p FAI surgery and gym routine to optimize proper hip /knee / foot alignment and pelvic floor co-activation with less overactivity and to minimize risk for injuries as pt wants to return to running. Pt benefits from a regional interdependent approach as pt demo'd pronation, hip IR which is related to overactivity of pelvic  floor mm. After external and internal treatment, pt demo'd decreased pelvic floor and improved lengthening of mm and less  straining with abdominal mm.   Pt has been participating in more social activities and enjoying himself with less focus on his pelvic Sx. Pt has been advised to work with a psychotherapist to help with the impact his Sx has had on his mental and social states. Pt voiced he will be more compliant with his pelvic floor Sx this week. Pt has benefited from biospyschosocial approaches to help with mm tension release, relaxation, and decreasing pelvic floor mm tensions. Pt continues to benefit from skilled PT to advance towards remaining goals.    Examination-Activity Limitations Stand;Sit    Stability/Clinical Decision Making Evolving/Moderate complexity    Rehab Potential Good    PT Frequency 1x / week    PT Duration --   10   PT Treatment/Interventions Therapeutic activities;Therapeutic exercise;Moist Heat;Balance training;Neuromuscular re-education;Functional mobility training;Stair training;Patient/family education;Manual techniques;Energy conservation    Consulted and Agree with Plan of Care Patient           Patient will benefit from skilled therapeutic intervention in order to improve the following deficits and impairments:  Decreased mobility, Decreased strength, Decreased scar mobility, Hypomobility, Postural dysfunction, Improper body mechanics, Pain, Decreased activity tolerance, Decreased endurance, Decreased safety awareness, Decreased coordination, Difficulty walking, Increased muscle spasms, Decreased balance  Visit Diagnosis: Unspecified lack of coordination  Chronic bilateral low back pain without sciatica  Pain in thoracic spine  Pelvic floor dysfunction  Other idiopathic scoliosis, thoracolumbar region     Problem List There are no problems to display for this patient.   Mariane Masters ,PT, DPT, E-RYT  02/16/2020, 11:41 AM  [facility] [facility] MAIN [facility] SERVICES [address] [place], [place], [address] Phone: [phone]   Fax:   [phone]  Name: Ricky Benson MRN: [id number] Date of Birth: 1995/09/24

## 2020-02-23 ENCOUNTER — Encounter: Payer: BC Managed Care – PPO | Admitting: Physical Therapy

## 2020-02-23 ENCOUNTER — Ambulatory Visit: Payer: BC Managed Care – PPO | Attending: General Surgery | Admitting: Physical Therapy

## 2020-02-23 ENCOUNTER — Other Ambulatory Visit: Payer: Self-pay

## 2020-02-23 DIAGNOSIS — G8929 Other chronic pain: Secondary | ICD-10-CM | POA: Diagnosis present

## 2020-02-23 DIAGNOSIS — M533 Sacrococcygeal disorders, not elsewhere classified: Secondary | ICD-10-CM | POA: Insufficient documentation

## 2020-02-23 DIAGNOSIS — M6289 Other specified disorders of muscle: Secondary | ICD-10-CM | POA: Insufficient documentation

## 2020-02-23 DIAGNOSIS — M4125 Other idiopathic scoliosis, thoracolumbar region: Secondary | ICD-10-CM | POA: Diagnosis present

## 2020-02-23 DIAGNOSIS — M546 Pain in thoracic spine: Secondary | ICD-10-CM | POA: Diagnosis present

## 2020-02-23 DIAGNOSIS — M545 Low back pain, unspecified: Secondary | ICD-10-CM | POA: Insufficient documentation

## 2020-02-23 DIAGNOSIS — R279 Unspecified lack of coordination: Secondary | ICD-10-CM | POA: Diagnosis present

## 2020-02-23 NOTE — Patient Instructions (Addendum)
Multifidis twist  Band is on doorknob: stand further away from door (facing perpendicular)   Twisting trunk without moving the hips and knees Hold band at the level of ribcage, elbows bent,shoulder blades roll back and down like squeezing a pencil under armpit    Exhale twist,.10-15 deg away from door without moving your hips/ knees. Continue to maintain equal weight through legs. Keep knee unlocked.  15 x 2    ___   PLEATS: BALLERINA  Heel raises in ballerina position, pressing rolled towel between heels  Handsat counter   Knees bent pointed out like a "v" , navel ( center of mass) more forward  Heels together as you lift, pointed out like a "v"  Pressing the inside of heels against each other and fan out pinky side of ballmounds   KNEES ARE ALIGNED BEHIND THE TOES TO MINIMIZE STRAIN ON THE KNEES Lower heels down slow with your  navel and you sit into mini squat ( center of mass) more forward to avoid dropping down fast and rocking more weight back onto heels   30 reps  ________

## 2020-02-23 NOTE — Therapy (Signed)
[place] [facility] MAIN [facility] SERVICES [address] [place], [place], [address] Phone: [phone]   Fax:  [phone]  Physical Therapy Treatment  Patient Details  Name: Ricky Benson MRN: [id number] Date of Birth: 1996-02-20 Referring Provider (PT): Byrnette    Encounter Date: 02/23/2020   PT End of Session - 02/23/20 0847    Visit Number 7    Date for PT Re-Evaluation 04/26/20    PT Start Time 0807    PT Stop Time 0900    PT Time Calculation (min) 53 min    Activity Tolerance Patient tolerated treatment well    Behavior During Therapy [facility] for tasks assessed/performed           Past Medical History:  Diagnosis Date  . Chronic tonsillitis and adenoiditis     Past Surgical History:  Procedure Laterality Date  .  hip Bilateral 2021   laproscopy for FAI   . NO PAST SURGERIES    . TONSILLECTOMY AND ADENOIDECTOMY N/A 05/16/2016   Procedure: TONSILLECTOMY AND ADENOIDECTOMY;  Surgeon: Linus Salmons, MD;  Location: [facility];  Service: ENT;  Laterality: N/A;  . WISDOM TOOTH EXTRACTION Bilateral 2019    There were no vitals filed for this visit.   Subjective Assessment - 02/23/20 0810    Subjective Pt reported feeling the stretches with teh pelvic floor. Pt has been doing his HEP .    Pertinent History FAI surgery BLE in 2021 and currently with PT in [place] since Feb to current.  Hx of hip and back pain since 18 years.  Pt had Sx from bottom of ribcage to top of hipbone/ QL and anterior thigh/ groin BLE. Pain occured with lifting leg in marching position, biking, and running.  Initially Dx impingement  on R and then L.  Biking is better after surgery and PT. Running is not cleared for yet. Pain now after surgery occurs with sitting and standing for too long ( > ) with inner thigh tightness.  Hx of fall onto tailbone in middle school. Denied injuries to ankle/ knees.    Patient Stated Goals to improve Sx and overall movement  and return to activities: exercise, running, lifting, and gain an understanding to return to his pattern              [facility] PT Assessment - 02/23/20 0848      Other:   Other/ Comments weight lifting : predominate bench positions, when in upright, increased lumbar lordosis      AROM   Overall AROM Comments mid/hind foot mobility present B       Palpation   Palpation comment L tightness at hamstring                       Pelvic Floor Special Questions - 02/23/20 0848    External Perineal Exam through clothing     External Palpation tightness at ischial tuberosity and hamstring attachments L              [facility] Adult PT Treatment/Exercise - 02/23/20 0849      Neuro Re-ed    Neuro Re-ed Details  cued for multifidis twist and new plantar fascia strengthening HEP, modified weight lifting routine in upright positoins with cues for less lumbar lordosis        Manual Therapy   Manual therapy comments STM/MWM hamstrings/ L  PT Long Term Goals - 02/16/20 1010      PT LONG TERM GOAL #1   Title Pt will report decreased clear fecal seepage from daily to 3 x week in order to return to functional activities    Time 6    Period Weeks    Status On-going      PT LONG TERM GOAL #2   Title Pt will demo proper deep core coordination  in order straining abdomen and pelvic floor to improve IAP    Time 4    Period Weeks    Status Achieved      PT LONG TERM GOAL #3   Title Pt will report decreased LBP on FOTO from 63 pts to < 93 pts in order to return ADLs and fitness ( 9/27: 94 pts)    Time 10    Period Weeks    Status Achieved      PT LONG TERM GOAL #4   Title Pt will demo no spinal deivation and more levelled shoulder height/ inferior angle in order to minimize LBP and progress to thoracolumbar strengthening exercises    Time 4    Period Weeks    Status Achieved      PT LONG TERM GOAL #5   Title Pt will demo proper coordination  of deep core and strength in order to minimize downward bearing of pelvic floor and minimize  clear fecal seepage    Time 6    Period Weeks    Status On-going      Additional Long Term Goals   Additional Long Term Goals Yes      PT LONG TERM GOAL #6   Title Pt will demo no pelvic floor mm tightness and demo proper lengthening in order to minimize leakage    Time 10    Period Weeks    Status New    Target Date 04/26/20      PT LONG TERM GOAL #7   Title Pt will be IND with modifications to fitness routine at the gym in order to minimize bearing down of pelvic floor and no ab overuse to minimize risk for leakage and    Time 10    Period Weeks    Status New    Target Date 04/26/20                 Plan - 02/23/20 0848    Clinical Impression Statement Pt demo'd increased foot mobility and was advanced to more plant arch strengthening to minimize pronation which is associated with overactivity of pelvic floor and increased risk for hip injuries. Pt required minor manual Tx at L ischial tuberosity and hamstrings today. Modified pt's weight lifting routine to more upright positions and required cues to minimize lumbar lordosis. Pt continues to benefit from skilled PT   Examination-Activity Limitations Stand;Sit    Stability/Clinical Decision Making Evolving/Moderate complexity    Rehab Potential Good    PT Frequency 1x / week    PT Duration --   10   PT Treatment/Interventions Therapeutic activities;Therapeutic exercise;Moist Heat;Balance training;Neuromuscular re-education;Functional mobility training;Stair training;Patient/family education;Manual techniques;Energy conservation    Consulted and Agree with Plan of Care Patient           Patient will benefit from skilled therapeutic intervention in order to improve the following deficits and impairments:  Decreased mobility, Decreased strength, Decreased scar mobility, Hypomobility, Postural dysfunction, Improper body mechanics,  Pain, Decreased activity tolerance, Decreased endurance, Decreased safety awareness, Decreased coordination, Difficulty walking,  Increased muscle spasms, Decreased balance  Visit Diagnosis: Unspecified lack of coordination  Chronic bilateral low back pain without sciatica  Pain in thoracic spine  Pelvic floor dysfunction  Other idiopathic scoliosis, thoracolumbar region     Problem List There are no problems to display for this patient.   Mariane Masters ,PT, DPT, E-RYT  02/23/2020, 9:13 AM  [place] [facility] MAIN [facility] SERVICES [address] [place], [place], [address] Phone: [phone]   Fax:  [phone]  Name: TRAMANE GORUM MRN: [id number] Date of Birth: [birth year]

## 2020-03-01 ENCOUNTER — Other Ambulatory Visit: Payer: Self-pay

## 2020-03-01 ENCOUNTER — Ambulatory Visit: Payer: BC Managed Care – PPO | Admitting: Physical Therapy

## 2020-03-01 DIAGNOSIS — R279 Unspecified lack of coordination: Secondary | ICD-10-CM | POA: Diagnosis not present

## 2020-03-01 DIAGNOSIS — M546 Pain in thoracic spine: Secondary | ICD-10-CM

## 2020-03-01 DIAGNOSIS — M6289 Other specified disorders of muscle: Secondary | ICD-10-CM

## 2020-03-01 DIAGNOSIS — G8929 Other chronic pain: Secondary | ICD-10-CM

## 2020-03-01 DIAGNOSIS — M4125 Other idiopathic scoliosis, thoracolumbar region: Secondary | ICD-10-CM

## 2020-03-01 NOTE — Patient Instructions (Addendum)
  Clam Shell 45 Degrees  Lying with hips and knees bent 45, one pillow between knees and ankles. Heel together, toes apart like ballerina,  Lift knee with exhale while pressing heels together. Be sure pelvis does not roll backward. Do not arch back. Do 20 times, each leg, 2 times per day.     Complimentary stretch: [employer] _ foot over _ thigh, opposite knee straight    3 breaths  * Keep pelvis levelled with tactile cue with hand under back of hips  * Slide the ankle of the supporting foot out to decrease the angle which can help level the pelvis   ___   Notice inhale to relax pelvic floor into a bowl  Pelvic tilts

## 2020-03-01 NOTE — Therapy (Signed)
[place] [facility] MAIN [facility] SERVICES [address] [place], [place], [address] Phone: [phone]   Fax:  [phone]  Physical Therapy Treatment  Patient Details  Name: Ricky Benson MRN: [id number] Date of Birth: 1995-06-21 Referring Provider (PT): Byrnette    Encounter Date: 03/01/2020   PT End of Session - 03/01/20 0822    Visit Number 8    Date for PT Re-Evaluation 04/26/20    PT Start Time 0803    PT Stop Time 0900    PT Time Calculation (min) 57 min    Activity Tolerance Patient tolerated treatment well    Behavior During Therapy [facility] for tasks assessed/performed           Past Medical History:  Diagnosis Date  . Chronic tonsillitis and adenoiditis     Past Surgical History:  Procedure Laterality Date  .  hip Bilateral 2021   laproscopy for FAI   . NO PAST SURGERIES    . TONSILLECTOMY AND ADENOIDECTOMY N/A 05/16/2016   Procedure: TONSILLECTOMY AND ADENOIDECTOMY;  Surgeon: Linus Salmons, MD;  Location: [facility];  Service: ENT;  Laterality: N/A;  . WISDOM TOOTH EXTRACTION Bilateral 2019    There were no vitals filed for this visit.   Subjective Assessment - 03/01/20 0807    Subjective Pt had no difficulties with the new exercises from last week. Pt reported he noticed less shoulder issues with his workout routine. Pt notices when he feels he has gas above the belt line or when his stomach is being upset, that these are the triggers to his leakage.    Pertinent History FAI surgery BLE in 2021 and currently with PT in [place] since Feb to current.  Hx of hip and back pain since 18 years.  Pt had Sx from bottom of ribcage to top of hipbone/ QL and anterior thigh/ groin BLE. Pain occured with lifting leg in marching position, biking, and running.  Initially Dx impingement  on R and then L.  Biking is better after surgery and PT. Running is not cleared for yet. Pain now after surgery occurs with sitting and standing for  too long ( > ) with inner thigh tightness.  Hx of fall onto tailbone in middle school. Denied injuries to ankle/ knees.    Patient Stated Goals to improve Sx and overall movement and return to activities: exercise, running, lifting, and gain an understanding to return to his pattern              [facility] PT Assessment - 03/01/20 0808      Strength   Overall Strength Comments no UE support PF 4/5 R 10 reps, L 4 reps, hip abd L 3+/5, R 4-/5 ,  DF/EV B 5/5           Palpation   SI assessment  iliac crest levelled, no deviation of thoracic spine , shoulders levelled     Palpation comment L tightness at peroneal longus/ brevis                       Pelvic Floor Special Questions - 03/01/20 0855    External Perineal Exam without clothing, pt consented without contraindications     Pelvic Floor Internal Exam pt consented without contraindications     Exam Type Rectal    Palpation tightness/ tenderness 4-6 oclock, dyscoordination with tightening of pelvic floor mm with inhalation ,  [facility] Adult PT Treatment/Exercise - 03/01/20 0856      Neuro Re-ed    Neuro Re-ed Details  cued for pelvic tilts in sidelying during Tx and in seated position to correct dyssynergia       Manual Therapy   Internal Pelvic Floor STM/MWM at problem area noted in assessment                        PT Long Term Goals - 02/16/20 1010      PT LONG TERM GOAL #1   Title Pt will report decreased clear fecal seepage from daily to 3 x week in order to return to functional activities    Time 6    Period Weeks    Status On-going      PT LONG TERM GOAL #2   Title Pt will demo proper deep core coordination  in order straining abdomen and pelvic floor to improve IAP    Time 4    Period Weeks    Status Achieved      PT LONG TERM GOAL #3   Title Pt will report decreased LBP on FOTO from 63 pts to < 93 pts in order to return ADLs and fitness ( 9/27: 94 pts)    Time 10     Period Weeks    Status Achieved      PT LONG TERM GOAL #4   Title Pt will demo no spinal deivation and more levelled shoulder height/ inferior angle in order to minimize LBP and progress to thoracolumbar strengthening exercises    Time 4    Period Weeks    Status Achieved      PT LONG TERM GOAL #5   Title Pt will demo proper coordination of deep core and strength in order to minimize downward bearing of pelvic floor and minimize  clear fecal seepage    Time 6    Period Weeks    Status On-going      Additional Long Term Goals   Additional Long Term Goals Yes      PT LONG TERM GOAL #6   Title Pt will demo no pelvic floor mm tightness and demo proper lengthening in order to minimize leakage    Time 10    Period Weeks    Status New    Target Date 04/26/20      PT LONG TERM GOAL #7   Title Pt will be IND with modifications to fitness routine at the gym in order to minimize bearing down of pelvic floor and no ab overuse to minimize risk for leakage and    Time 10    Period Weeks    Status New    Target Date 04/26/20                 Plan - 03/01/20 6606    Clinical Impression Statement Pt demo'd improved spinal /pelvic/ shoulder alignment. Foot arches are getting stronger but hip abduction strength L > R remains weak. Pt was educated to perform hip abduction strengthening to help with minimizing injuries/ medial collapse of feet/ overactivity of pelvic floor as he returns to running.Internal rectal assessment showed dyssynergia and posterior mm tightness. Pt required cues for proper lengthening of pelvic floor mm to correct dysynergia which will likely help minimize his notice of pain by rectum. Pt continues to benefit from skilled PT     Personal Factors and Comorbidities Profession    Examination-Activity Limitations Stand;Sit  Stability/Clinical Decision Making Evolving/Moderate complexity    Rehab Potential Good    PT Frequency 1x / week    PT Duration --   10   PT  Treatment/Interventions Therapeutic activities;Therapeutic exercise;Moist Heat;Balance training;Neuromuscular re-education;Functional mobility training;Stair training;Patient/family education;Manual techniques;Energy conservation    Consulted and Agree with Plan of Care Patient           Patient will benefit from skilled therapeutic intervention in order to improve the following deficits and impairments:  Decreased mobility, Decreased strength, Decreased scar mobility, Hypomobility, Postural dysfunction, Improper body mechanics, Pain, Decreased activity tolerance, Decreased endurance, Decreased safety awareness, Decreased coordination, Difficulty walking, Increased muscle spasms, Decreased balance  Visit Diagnosis: Unspecified lack of coordination  Chronic bilateral low back pain without sciatica  Pain in thoracic spine  Pelvic floor dysfunction  Other idiopathic scoliosis, thoracolumbar region     Problem List There are no problems to display for this patient.   Mariane Masters ,PT, DPT, E-RYT  03/01/2020, 9:30 AM  [facility] MAIN [facility] SERVICES [address] [place], [place], [address] Phone: [phone]   Fax:  [phone]  Name: Ricky Benson MRN: [id number] Date of Birth: 1995-09-07

## 2020-03-08 ENCOUNTER — Ambulatory Visit: Payer: BC Managed Care – PPO | Admitting: Physical Therapy

## 2020-03-15 ENCOUNTER — Ambulatory Visit: Payer: BC Managed Care – PPO | Admitting: Physical Therapy

## 2020-03-29 ENCOUNTER — Encounter: Payer: BC Managed Care – PPO | Admitting: Physical Therapy

## 2020-04-23 ENCOUNTER — Other Ambulatory Visit: Payer: Self-pay

## 2020-04-23 ENCOUNTER — Ambulatory Visit: Payer: BC Managed Care – PPO | Attending: General Surgery | Admitting: Physical Therapy

## 2020-04-23 DIAGNOSIS — G8929 Other chronic pain: Secondary | ICD-10-CM | POA: Insufficient documentation

## 2020-04-23 DIAGNOSIS — M4125 Other idiopathic scoliosis, thoracolumbar region: Secondary | ICD-10-CM | POA: Insufficient documentation

## 2020-04-23 DIAGNOSIS — M546 Pain in thoracic spine: Secondary | ICD-10-CM | POA: Diagnosis present

## 2020-04-23 DIAGNOSIS — R279 Unspecified lack of coordination: Secondary | ICD-10-CM | POA: Diagnosis present

## 2020-04-23 DIAGNOSIS — M6289 Other specified disorders of muscle: Secondary | ICD-10-CM | POA: Insufficient documentation

## 2020-04-23 DIAGNOSIS — M545 Low back pain, unspecified: Secondary | ICD-10-CM | POA: Diagnosis present

## 2020-04-23 NOTE — Patient Instructions (Addendum)
Deep core level 1 and 2    Body scan   ___  When noticing stomach issues/ tightness in seated position  _ notice feet and sitting bones are planted ( house foundation) _gentle pelvic rocking to find neutral not tucked under)   _ tricep dips or spinal twists  Or hip flexion stretch on the chair   __  Pully cable : sidelunge with shoulder abd/add with elbw bent for less rounded shoulders and oblique strengthening

## 2020-04-23 NOTE — Therapy (Addendum)
[place] [facility] MAIN [facility] SERVICES [address] [place], [place], [address] Phone: [phone]   Fax:  [phone]  Physical Therapy Treatment  Patient Details  Name: Ricky Benson MRN: [id number] Date of Birth: September 07, 1995 Referring Provider (PT): Byrnette    Encounter Date: 04/23/2020   PT End of Session - 04/23/20 1014    Visit Number 7    Date for PT Re-Evaluation 04/26/20    PT Start Time 1010    PT Stop Time 1110    PT Time Calculation (min) 60 min    Activity Tolerance Patient tolerated treatment well    Behavior During Therapy [facility] for tasks assessed/performed           Past Medical History:  Diagnosis Date  . Chronic tonsillitis and adenoiditis     Past Surgical History:  Procedure Laterality Date  .  hip Bilateral 2021   laproscopy for FAI   . NO PAST SURGERIES    . TONSILLECTOMY AND ADENOIDECTOMY N/A 05/16/2016   Procedure: TONSILLECTOMY AND ADENOIDECTOMY;  Surgeon: Linus Salmons, MD;  Location: [facility];  Service: ENT;  Laterality: N/A;  . WISDOM TOOTH EXTRACTION Bilateral 2019    There were no vitals filed for this visit.   Subjective Assessment - 04/23/20 1012    Subjective Pt reports leakage issues are as bad as they have been. Pt feels tightness / inflammation inthe area. Pt is not sure if his stomach is upset and this is related to the pelvic floor. Pt feels anxiety can manifest as stomach issues. Pt would like to work with a psychotherapist and decrease pelvic PT once a month due to his busy college schedule. Pt was able to do his Pelvic PT exercises once week which is not as much as he would like. Pt finds that he will have time over college break to set a foundation with the exercises routine.    Pertinent History FAI surgery BLE in 2021 and currently with PT in [place] since Feb to current.  Hx of hip and back pain since 18 years.  Pt had Sx from bottom of ribcage to top of hipbone/ QL and anterior  thigh/ groin BLE. Pain occured with lifting leg in marching position, biking, and running.  Initially Dx impingement  on R and then L.  Biking is better after surgery and PT. Running is not cleared for yet. Pain now after surgery occurs with sitting and standing for too long ( > ) with inner thigh tightness.  Hx of fall onto tailbone in middle school. Denied injuries to ankle/ knees.    Patient Stated Goals to improve Sx and overall movement and return to activities: exercise, running, lifting, and gain an understanding to return to his pattern              [facility] PT Assessment - 04/23/20 1031      Observation/Other Assessments   Scoliosis pelvic alignment and shoulders levelled, no devaitions to spine       Strength   Overall Strength Comments PF 15 rep R,  25 L,   DF EV L 4+/5, 4-/5 R                       Pelvic Floor Special Questions - 04/23/20 1040    Diastasis Recti no fingers width      External Perineal Exam with clothing: tightness at 3rd layer ( cued for feet co-activation and neutral pelvic tilt with less  tensions)                            PT Long Term Goals -12/3//21 1010      PT LONG TERM GOAL #1   Title Pt will report decreased clear fecal seepage from daily to 3 x week in order to return to functional activities    Time 6    Period Weeks    Status On-going      PT LONG TERM GOAL #2   Title Pt will demo proper deep core coordination  in order straining abdomen and pelvic floor to improve IAP    Time 4    Period Weeks    Status Achieved      PT LONG TERM GOAL #3   Title Pt will report decreased LBP on FOTO from 63 pts to < 93 pts in order to return ADLs and fitness ( 9/27: 94 pts)    Time 10    Period Weeks    Status Achieved      PT LONG TERM GOAL #4   Title Pt will demo no spinal deivation and more levelled shoulder height/ inferior angle in order to minimize LBP and progress to thoracolumbar strengthening exercises     Time 4    Period Weeks    Status Achieved      PT LONG TERM GOAL #5   Title Pt will demo proper coordination of deep core and strength in order to minimize downward bearing of pelvic floor and minimize  clear fecal seepage    Time 6    Period Weeks    Status On-going      Additional Long Term Goals   Additional Long Term Goals Yes      PT LONG TERM GOAL #6   Title Pt will demo no pelvic floor mm tightness and demo proper lengthening in order to minimize leakage    Time 10    Period Weeks    Status New    Target Date 04/26/20      PT LONG TERM GOAL #7   Title Pt will be IND with modifications to fitness routine at the gym in order to minimize bearing down of pelvic floor and no ab overuse to minimize risk for leakage and    Time 10    Period Weeks    Status New    Target Date 04/26/20                 Plan - 04/23/20 1249    Clinical Impression Statement Discussed importance of working with psychotherapist to help pt's mental and emotional barriers he has faced due to pelvic issues. Referred pt to a therapist who can consult via telehealth which will be helpful with pt's busy school schedule. Discussed plans to modify his workout routine to minimize pelvic floor overactivity. Discussed strategies to build compliance to pelvic HEP. Pt has been compliant with HEP related to his workout and plan to integrate neuromuscular re-education into his workout to help him gain awareness to not strain nor tighten pelvic floor mm.   Pt no longer showed unequal pelvic girdle/ shoulder and less spinal deviations. Pt showed increased ankle strength but still needs to improve PF strength on R. Pt showed less pelvic floor tightness with cue for more co-activation of feet.  Pt continues to benefit from skilled PT    Personal Factors and Comorbidities Profession    Examination-Activity Limitations Stand;Sit  Stability/Clinical Decision Making Evolving/Moderate complexity    Rehab Potential  Good    PT Frequency 1x / week    PT Duration --   10   PT Treatment/Interventions Therapeutic activities;Therapeutic exercise;Moist Heat;Balance training;Neuromuscular re-education;Functional mobility training;Stair training;Patient/family education;Manual techniques;Energy conservation    Consulted and Agree with Plan of Care Patient           Patient will benefit from skilled therapeutic intervention in order to improve the following deficits and impairments:  Decreased mobility, Decreased strength, Decreased scar mobility, Hypomobility, Postural dysfunction, Improper body mechanics, Pain, Decreased activity tolerance, Decreased endurance, Decreased safety awareness, Decreased coordination, Difficulty walking, Increased muscle spasms, Decreased balance  Visit Diagnosis: Unspecified lack of coordination  Chronic bilateral low back pain without sciatica  Pain in thoracic spine  Pelvic floor dysfunction  Other idiopathic scoliosis, thoracolumbar region     Problem List There are no problems to display for this patient.   Mariane Masters ,PT, DPT, E-RYT  04/23/2020, 12:51 PM  [place] [facility] MAIN [facility] SERVICES [address] [place], [place], [address] Phone: [phone]   Fax:  [phone]  Name: Ricky Benson MRN: [id number] Date of Birth: 03-24-1996

## 2020-04-27 ENCOUNTER — Ambulatory Visit: Payer: BC Managed Care – PPO | Admitting: Physical Therapy

## 2020-04-27 ENCOUNTER — Other Ambulatory Visit: Payer: Self-pay

## 2020-04-27 DIAGNOSIS — R279 Unspecified lack of coordination: Secondary | ICD-10-CM

## 2020-04-27 DIAGNOSIS — M4125 Other idiopathic scoliosis, thoracolumbar region: Secondary | ICD-10-CM

## 2020-04-27 DIAGNOSIS — G8929 Other chronic pain: Secondary | ICD-10-CM

## 2020-04-27 DIAGNOSIS — M6289 Other specified disorders of muscle: Secondary | ICD-10-CM

## 2020-04-27 DIAGNOSIS — M546 Pain in thoracic spine: Secondary | ICD-10-CM

## 2020-04-27 DIAGNOSIS — M545 Low back pain, unspecified: Secondary | ICD-10-CM

## 2020-04-27 NOTE — Therapy (Signed)
[place] MAIN [facility] SERVICES [address] [place], [place], [address] Phone: [phone]   Fax:  [phone]  Physical Therapy Treatment / Progress note   Patient Details  Name: Ricky Benson MRN: [id number] Date of Birth: 25-Feb-1996 Referring Provider (PT): Byrnette    Encounter Date: 04/27/2020   PT End of Session - 04/27/20 1011    Visit Number 8    Date for PT Re-Evaluation 07/06/20    PT Start Time 1000    PT Stop Time 1114    PT Time Calculation (min) 74 min    Activity Tolerance Patient tolerated treatment well    Behavior During Therapy [facility] for tasks assessed/performed           Past Medical History:  Diagnosis Date  . Chronic tonsillitis and adenoiditis     Past Surgical History:  Procedure Laterality Date  .  hip Bilateral 2021   laproscopy for FAI   . NO PAST SURGERIES    . TONSILLECTOMY AND ADENOIDECTOMY N/A 05/16/2016   Procedure: TONSILLECTOMY AND ADENOIDECTOMY;  Surgeon: Beverly Gust, MD;  Location: [place];  Service: ENT;  Laterality: N/A;  . WISDOM TOOTH EXTRACTION Bilateral 2019    There were no vitals filed for this visit.   Subjective Assessment - 04/27/20 1119    Subjective Pt reported his workout changed while he was taking exams and did not have access to a gym so his exercises did not include machines and have more body weight training    Pertinent History FAI surgery BLE in 2021 and currently with PT in [place] since Feb to current.  Hx of hip and back pain since 18 years.  Pt had Sx from bottom of ribcage to top of hipbone/ QL and anterior thigh/ groin BLE. Pain occured with lifting leg in marching position, biking, and running.  Initially Dx impingement  on R and then L.  Biking is better after surgery and PT. Running is not cleared for yet. Pain now after surgery occurs with sitting and standing for too long ( > 56mn) with inner thigh tightness.  Hx of fall onto tailbone in middle  school. Denied injuries to ankle/ knees.    Patient Stated Goals to improve Sx and overall movement and return to activities: exercise, running, lifting, and gain an understanding to return to his pattern              [facility]      Observation/Other Assessments   Observations hyperextended elbows in plank, increased lumbar lordosis/ hyperextended knees  in squats,                          [facility] Adult PT Treatment/Exercise - 04/27/20 1104      Therapeutic Activites    Other Therapeutic Activities reassessed goals,  explained principles to alignment and technique with his workout to minimize overactivity of pelvic floor        Neuro Re-ed    Neuro Re-ed Details  cued for downgrade for squat, sidelplank, plank , alignment to minimize injuries and overactivity of pelvic floor, minimize hyperextension of knee        Manual Therapy   Kinesiotex --   behind knee to prevent  hyperextension                      PT Long Term Goals - 04/27/20 1018  PT LONG TERM GOAL #1   Title Pt will report decreased clear fecal seepage from daily to 3 x week in order to return to functional activities    Time 6    Period Weeks    Status On-going      PT LONG TERM GOAL #2   Title Pt will demo proper deep core coordination  in order straining abdomen and pelvic floor to improve IAP    Time 4    Period Weeks    Status Achieved      PT LONG TERM GOAL #3   Title Pt will report decreased LBP on FOTO from 63 pts to < 93 pts in order to return ADLs and fitness ( 9/27: 94 pts)    Time 10    Period Weeks    Status Achieved      PT LONG TERM GOAL #4   Title Pt will demo no spinal deivation and more levelled shoulder height/ inferior angle in order to minimize LBP and progress to thoracolumbar strengthening exercises    Time 4    Period Weeks    Status Achieved      PT LONG TERM GOAL #5   Title Pt will demo proper coordination of deep core  and strength in order to minimize downward bearing of pelvic floor and minimize  clear fecal seepage    Time 6    Period Weeks    Status Achieved      Additional Long Term Goals   Additional Long Term Goals Yes      PT LONG TERM GOAL #6   Title Pt will demo no pelvic floor mm tightness and demo proper lengthening in order to minimize leakage    Time 10    Period Weeks    Status Partially Met      PT LONG TERM GOAL #7   Title Pt will be IND with modifications to fitness routine at the gym in order to minimize bearing down of pelvic floor and no ab overuse to minimize risk for leakage and    Time 10    Period Weeks    Status On-going      PT LONG TERM GOAL #8   Title Pt will improve his FOTO score for Bowel Leakage from 45 pts to > 50 pts in order to paritcipates social activities with confidence to not have to hurry to the bathroom    Time 10    Period Weeks    Status New    Target Date 07/06/20                 Plan - 04/27/20 1115    Clinical Impression Statement Pt has achieved 4/8 goals. Pt's LBP pain has improved significantly as indicated by FOTO score 63 pts to 94 pts. This improvement occurred with manual Tx and HEP that to help correct more equal pelvic alignment and spinal deviations.  With more levelled pelvic girdle and less spinal deviations, pt's pelvic floor treatments will yield better outcomes. Pt has shown decreased pelvic floor mm tightness and improved propioception of pelvic tilt and upright posture to optimize IAP system. These improvements will be helpful to help with his fecal leakage. Currently working on modifying and educating pt on proper techniques to his workout routine to address his hypermobility that contributes to increased lumbar lordosis, hyperextension of knees and elbows in squats, planks, side planks, romanian deadlift. After cues and training today, pt showed increased propioception to minimize hyperextension of  knees which will help with  decreasing tightening of pelvic floor mm and thus, help with decreasing fecal leakage.    Pt has been referred to a psychotherapist to support biopsychosocial strategies. Anticipate this will help pt accelerate his progress to overcome the mental and emotional barriers pt has faced due to the impact of his pelvic issues.   Plan to continue with modifying workout routines and educating pt on principles to maintain a well rounded routine to minimize injuries and straining pelvic floor. Pt continues to benefit from skilled PT.      Personal Factors and Comorbidities Profession    Examination-Activity Limitations Stand;Sit    Stability/Clinical Decision Making Evolving/Moderate complexity    Rehab Potential Good    PT Frequency 1x / week    PT Duration --   10   PT Treatment/Interventions Therapeutic activities;Therapeutic exercise;Moist Heat;Balance training;Neuromuscular re-education;Functional mobility training;Stair training;Patient/family education;Manual techniques;Energy conservation    Consulted and Agree with Plan of Care Patient           Patient will benefit from skilled therapeutic intervention in order to improve the following deficits and impairments:  Decreased mobility, Decreased strength, Decreased scar mobility, Hypomobility, Postural dysfunction, Improper body mechanics, Pain, Decreased activity tolerance, Decreased endurance, Decreased safety awareness, Decreased coordination, Difficulty walking, Increased muscle spasms, Decreased balance  Visit Diagnosis: Unspecified lack of coordination  Chronic bilateral low back pain without sciatica  Pain in thoracic spine  Pelvic floor dysfunction  Other idiopathic scoliosis, thoracolumbar region     Problem List There are no problems to display for this patient.   Jerl Mina 04/27/2020, 11:20 AM  [address] MAIN [facility] SERVICES [address] [place], [place], [address] Phone:  [phone]   Fax:  [phone]  Name: Ricky Benson MRN: [id number] Date of Birth: [birth year]

## 2020-04-27 NOTE — Patient Instructions (Signed)
Modified routine   Do not use bands this week:   Minisquat: ( dont lock knees, focus on ballmounds, front abs, )  Then side step  __  Goblet squat  _ no arch in back   _ RDL   Tap the back ballmounds to the awll Standing knee slightly bent   __  Plank:  - no hyperextended elbows , ballmounds , chin tuck  Sideplank:   Forearm at 45dg not 90deg Top leg with knee bent , shin 90 deg to floor  Active bottom foot   __     Floor to stand :   downward dog   crawl hands back, butt is back, knees behind toes -> squat  Hands at waist , elbows back, chest lifts

## 2020-05-04 ENCOUNTER — Ambulatory Visit: Payer: BC Managed Care – PPO | Admitting: Physical Therapy

## 2020-05-11 ENCOUNTER — Other Ambulatory Visit: Payer: Self-pay

## 2020-05-11 ENCOUNTER — Ambulatory Visit: Payer: BC Managed Care – PPO | Admitting: Physical Therapy

## 2020-05-11 DIAGNOSIS — G8929 Other chronic pain: Secondary | ICD-10-CM

## 2020-05-11 DIAGNOSIS — R279 Unspecified lack of coordination: Secondary | ICD-10-CM | POA: Diagnosis not present

## 2020-05-11 DIAGNOSIS — M546 Pain in thoracic spine: Secondary | ICD-10-CM

## 2020-05-11 DIAGNOSIS — M6289 Other specified disorders of muscle: Secondary | ICD-10-CM

## 2020-05-11 DIAGNOSIS — M4125 Other idiopathic scoliosis, thoracolumbar region: Secondary | ICD-10-CM

## 2020-05-11 NOTE — Patient Instructions (Signed)
Rotator cuff ( ER/ IR)   blue bands  elbows by ribs   10 reps   Inhale exhale then pull   __  Practice squats to not have increased arch in back   squat without bands     thumbs on ribs and index finger on iliac crest,  "drop tail down " once closer to standing   __  Stretching teres minor  at gym    -dophin glide against wall(  Watch out for less low back arch)  10 reps    At home: wing/ open book with pillows

## 2020-05-11 NOTE — Therapy (Signed)
[place] MAIN [facility] SERVICES [address] [place], [place], [address] Phone: [phone]   Fax:  [phone]  Physical Therapy Treatment  Patient Details  Name: Ricky Benson MRN: [id number] Date of Birth: Jun 23, 1995 Referring Provider (PT): Byrnette    Encounter Date: 05/11/2020   PT End of Session - 05/11/20 1024    Visit Number 9    Date for PT Re-Evaluation 07/06/20    PT Start Time 1000    PT Stop Time 1100    PT Time Calculation (min) 60 min    Activity Tolerance Patient tolerated treatment well    Behavior During Therapy [facility] for tasks assessed/performed           Past Medical History:  Diagnosis Date   Chronic tonsillitis and adenoiditis     Past Surgical History:  Procedure Laterality Date    hip Bilateral 2021   laproscopy for FAI    NO PAST SURGERIES     TONSILLECTOMY AND ADENOIDECTOMY N/A 05/16/2016   Procedure: TONSILLECTOMY AND ADENOIDECTOMY;  Surgeon: Beverly Gust, MD;  Location: [place];  Service: ENT;  Laterality: N/A;   WISDOM TOOTH EXTRACTION Bilateral 2019    There were no vitals filed for this visit.   Subjective Assessment - 05/11/20 1024    Subjective Pt reported he noticed anterior shoulder pain with push ups , rows on cable, push ups.  Pt has applied to law school and heard back from 2 out of 7 . Pt is feeling positive about things moving forward in his life. Pt is also excited to work with the new psychotherapist    Pertinent History FAI surgery BLE in 2021 and currently with PT in [place] since Feb to current.  Hx of hip and back pain since 18 years.  Pt had Sx from bottom of ribcage to top of hipbone/ QL and anterior thigh/ groin BLE. Pain occured with lifting leg in marching position, biking, and running.  Initially Dx impingement  on R and then L.  Biking is better after surgery and PT. Running is not cleared for yet. Pain now after surgery occurs with sitting and standing for  too long ( > 91mn) with inner thigh tightness.  Hx of fall onto tailbone in middle school. Denied injuries to ankle/ knees.    Patient Stated Goals to improve Sx and overall movement and return to activities: exercise, running, lifting, and gain an understanding to return to his pattern              [facility] - 05[facility] 1100      Squat   Comments less cues for medial collpase and knees behind toes      Strength   Overall Strength Comments increased  lumbar lordosis, ribflare in resistance band shoulder height / squats      Palpation   Palpation comment increased teightness teres minor B                         [facility] Adult PT Treatment/Exercise - 05/11/20 1059      Therapeutic Activites    Other Therapeutic Activities active listening with pt on his excitement to work with psychotherapist      Neuro Re-ed    Neuro Re-ed Details  excessive cues for less lumbar lordosis, ribflare in resistance band shoulder height / squats      Manual Therapy   Internal Pelvic Floor STM/MWM to decreased increased teightness teres  minor B                       PT Long Term Goals - 04/27/20 1018      PT LONG TERM GOAL #1   Title Pt will report decreased clear fecal seepage from daily to 3 x week in order to return to functional activities    Time 6    Period Weeks    Status On-going      PT LONG TERM GOAL #2   Title Pt will demo proper deep core coordination  in order straining abdomen and pelvic floor to improve IAP    Time 4    Period Weeks    Status Achieved      PT LONG TERM GOAL #3   Title Pt will report decreased LBP on FOTO from 63 pts to < 93 pts in order to return ADLs and fitness ( 9/27: 94 pts)    Time 10    Period Weeks    Status Achieved      PT LONG TERM GOAL #4   Title Pt will demo no spinal deivation and more levelled shoulder height/ inferior angle in order to minimize LBP and progress to thoracolumbar strengthening exercises     Time 4    Period Weeks    Status Achieved      PT LONG TERM GOAL #5   Title Pt will demo proper coordination of deep core and strength in order to minimize downward bearing of pelvic floor and minimize  clear fecal seepage    Time 6    Period Weeks    Status Achieved      Additional Long Term Goals   Additional Long Term Goals Yes      PT LONG TERM GOAL #6   Title Pt will demo no pelvic floor mm tightness and demo proper lengthening in order to minimize leakage    Time 10    Period Weeks    Status Partially Met      PT LONG TERM GOAL #7   Title Pt will be IND with modifications to fitness routine at the gym in order to minimize bearing down of pelvic floor and no ab overuse to minimize risk for leakage and    Time 10    Period Weeks    Status On-going      PT LONG TERM GOAL #8   Title Pt will improve his FOTO score for Bowel Leakage from 45 pts to > 50 pts in order to paritcipates social activities with confidence to not have to hurry to the bathroom    Time 10    Period Weeks    Status New    Target Date 07/06/20                 Plan - 05/11/20 1253    Clinical Impression Statement Pt demo'd less cues for proper squat without collapsing medial arches. Required excessive cues for less increased lumbar lordosis/ slight posterior tilt of pelvis with squats. Withheld use of resistance bands because it caused more lumbar lordosis. Further neuromuscular reeducation is required with his fitness routine to improve form and technique and minimize overactivity of pelvic floor. Rotator cuff exercises were added to HEP to minimize risk for shoulder injuries and prepare pt to return to use of weights at a later time. Pt continues to benefit from skilled PT   Interdisciplinary Team update: Pt has a scheduled appt with psychotherapist this  week and PT has spoken with psychotherapist about his care per pt's consent and plan to continue interdisciplinary communication to facilitate  optimal pt 's progress.    Personal Factors and Comorbidities Profession    Examination-Activity Limitations Stand;Sit    Stability/Clinical Decision Making Evolving/Moderate complexity    Rehab Potential Good    PT Frequency 1x / week    PT Duration --   10   PT Treatment/Interventions Therapeutic activities;Therapeutic exercise;Moist Heat;Balance training;Neuromuscular re-education;Functional mobility training;Stair training;Patient/family education;Manual techniques;Energy conservation    Consulted and Agree with Plan of Care Patient           Patient will benefit from skilled therapeutic intervention in order to improve the following deficits and impairments:  Decreased mobility,Decreased strength,Decreased scar mobility,Hypomobility,Postural dysfunction,Improper body mechanics,Pain,Decreased activity tolerance,Decreased endurance,Decreased safety awareness,Decreased coordination,Difficulty walking,Increased muscle spasms,Decreased balance  Visit Diagnosis: Unspecified lack of coordination  Chronic bilateral low back pain without sciatica  Pain in thoracic spine  Pelvic floor dysfunction  Other idiopathic scoliosis, thoracolumbar region     Problem List There are no problems to display for this patient.   Jerl Mina ,PT, DPT, E-RYT  05/11/2020, 12:54 PM  [place] MAIN [facility] SERVICES [address] [place], [place], [address] Phone: [phone]   Fax:  [phone]  Name: Ricky Benson MRN: [id number] Date of Birth: Oct 31, 1995

## 2020-05-18 ENCOUNTER — Ambulatory Visit: Payer: BC Managed Care – PPO | Admitting: Physical Therapy

## 2020-05-18 ENCOUNTER — Other Ambulatory Visit: Payer: Self-pay

## 2020-05-18 DIAGNOSIS — M546 Pain in thoracic spine: Secondary | ICD-10-CM

## 2020-05-18 DIAGNOSIS — M4125 Other idiopathic scoliosis, thoracolumbar region: Secondary | ICD-10-CM

## 2020-05-18 DIAGNOSIS — G8929 Other chronic pain: Secondary | ICD-10-CM

## 2020-05-18 DIAGNOSIS — M6289 Other specified disorders of muscle: Secondary | ICD-10-CM

## 2020-05-18 DIAGNOSIS — R279 Unspecified lack of coordination: Secondary | ICD-10-CM | POA: Diagnosis not present

## 2020-05-18 NOTE — Patient Instructions (Addendum)
Cable collumn:   Multifidis twist  - 12.5 lbs  Band is on doorknob/ cable collumn : stand further away from door (facing perpendicular)   Twisting trunk without moving the hips and knees Hold band at the level of ribcage, elbows bent,shoulder blades roll back and down like squeezing a pencil under armpit    Exhale twist,.10-15 deg away from door without moving your hips/ knees. Continue to maintain equal weight through legs. Keep knee unlocked.  10 x 2   __    Extended side angle position with pulley  - 7.5 lbs   Feet are hip width apart, L foot one behind like you are on ski tracks,  R knee bent over ankle but not more forward then the ankle.  Make sure 50% weight is in the front foot/leg , 50% weight is the back foot/ leg    Rest R forearm lightly on top of thigh,  L hand with pulley  , elbow by ribs, ( less upper trap use)   10 reps   ___  BANDS :  therabands for rotator cuff   10 reps   ____ Feet:  Ballerina pleats, minisquat  10 reps   ___  Stretches:  Dolphin  No lumbar arch   ____  To open pect area and shoulder training  Reverse planks   Side planks    pect stretch at the door

## 2020-05-18 NOTE — Therapy (Signed)
[place] MAIN [facility] SERVICES [address] [place], [place], [address] Phone: [phone]   Fax:  [phone]  Physical Therapy Treatment  Patient Details  Name: Ricky Benson MRN: [id number] Date of Birth: [birth year] Referring Provider (PT): Ricky Benson    Encounter Date: 05/18/2020   PT End of Session - 05/18/20 1101    Visit Number 10    Number of Visits --   PN Visit 8   Date for PT Re-Evaluation 07/06/20    PT Start Time 1000    PT Stop Time 1053    PT Time Calculation (min) 53 min    Activity Tolerance Patient tolerated treatment well    Behavior During Therapy [facility] for tasks assessed/performed           Past Medical History:  Diagnosis Date   Chronic tonsillitis and adenoiditis     Past Surgical History:  Procedure Laterality Date    hip Bilateral 2021   laproscopy for FAI    NO PAST SURGERIES     TONSILLECTOMY AND ADENOIDECTOMY N/A 05/16/2016   Procedure: TONSILLECTOMY AND ADENOIDECTOMY;  Surgeon: Ricky Gust, MD;  Location: [place];  Service: ENT;  Laterality: N/A;   WISDOM TOOTH EXTRACTION Bilateral 2019    There were no vitals filed for this visit.   Subjective Assessment - 05/18/20 1005    Subjective Pt felt his psychotherapy session went great. Pt felt it has been hectic being home with responsibilities to help family and he is trying to set time for himself and get a break. The squat techniques definitely helped. Pt did not notice his fecal leakage being worse.    Pertinent History FAI surgery BLE in 2021 and currently with PT in [place] since Feb to current.  Hx of hip and back pain since 18 years.  Pt had Sx from bottom of ribcage to top of hipbone/ QL and anterior thigh/ groin BLE. Pain occured with lifting leg in marching position, biking, and running.  Initially Dx impingement  on R and then L.  Biking is better after surgery and PT. Running is not cleared for yet. Pain now after surgery  occurs with sitting and standing for too long ( > 12mn) with inner thigh tightness.  Hx of fall onto tailbone in middle school. Denied injuries to ankle/ knees.    Patient Stated Goals to improve Sx and overall movement and return to activities: exercise, running, lifting, and gain an understanding to return to his pattern                             [facility] - 05[facility]20 1100      Therapeutic Activites    Other Therapeutic Activities discussed principles for stability with coactivationof lower kinetic chain to minimize overactivity of pelvic floor      Neuro Re-ed    Neuro Re-ed Details  cued for past HEP at cable collumn, wall, floor  to minimize lumbar lordosis, hypermobility                       PT Long Term Goals - 04/27/20 1018      PT LONG TERM GOAL #1   Title Pt will report decreased clear fecal seepage from daily to 3 x week in order to return to functional activities    Time 6    Period Weeks    Status On-going  PT LONG TERM GOAL #2   Title Pt will demo proper deep core coordination  in order straining abdomen and pelvic floor to improve IAP    Time 4    Period Weeks    Status Achieved      PT LONG TERM GOAL #3   Title Pt will report decreased LBP on FOTO from 63 pts to < 93 pts in order to return ADLs and fitness ( 9/27: 94 pts)    Time 10    Period Weeks    Status Achieved      PT LONG TERM GOAL #4   Title Pt will demo no spinal deivation and more levelled shoulder height/ inferior angle in order to minimize LBP and progress to thoracolumbar strengthening exercises    Time 4    Period Weeks    Status Achieved      PT LONG TERM GOAL #5   Title Pt will demo proper coordination of deep core and strength in order to minimize downward bearing of pelvic floor and minimize  clear fecal seepage    Time 6    Period Weeks    Status Achieved      Additional Long Term Goals   Additional Long Term Goals Yes       PT LONG TERM GOAL #6   Title Pt will demo no pelvic floor mm tightness and demo proper lengthening in order to minimize leakage    Time 10    Period Weeks    Status Partially Met      PT LONG TERM GOAL #7   Title Pt will be IND with modifications to fitness routine at the gym in order to minimize bearing down of pelvic floor and no ab overuse to minimize risk for leakage and    Time 10    Period Weeks    Status On-going      PT LONG TERM GOAL #8   Title Pt will improve his FOTO score for Bowel Leakage from 45 pts to > 50 pts in order to paritcipates social activities with confidence to not have to hurry to the bathroom    Time 10    Period Weeks    Status New    Target Date 07/06/20                 Plan - 05/18/20 1102    Clinical Impression Statement Pt demo'd good carry over with squats with kettle bell but requried minor cues for more anterior COM to minimize lumbar lordosis/ pelvic floor tightening. Pt also required cues for more lower kinetic chain engagement with cable collumn and floor exercises. Pt continues to benefit from skilled PT to minimize overactivity of pelvic floor and minimize risk for injuries / mm imbalance   Personal Factors and Comorbidities Profession    Examination-Activity Limitations Stand;Sit    Stability/Clinical Decision Making Evolving/Moderate complexity    Rehab Potential Good    PT Frequency 1x / week    PT Duration --   10   PT Treatment/Interventions Therapeutic activities;Therapeutic exercise;Moist Heat;Balance training;Neuromuscular re-education;Functional mobility training;Stair training;Patient/family education;Manual techniques;Energy conservation    Consulted and Agree with Plan of Care Patient           Patient will benefit from skilled therapeutic intervention in order to improve the following deficits and impairments:  Decreased mobility,Decreased strength,Decreased scar mobility,Hypomobility,Postural dysfunction,Improper body  mechanics,Pain,Decreased activity tolerance,Decreased endurance,Decreased safety awareness,Decreased coordination,Difficulty walking,Increased muscle spasms,Decreased balance  Visit Diagnosis: Unspecified lack of coordination  Chronic bilateral low back pain without sciatica  Pain in thoracic spine  Pelvic floor dysfunction  Other idiopathic scoliosis, thoracolumbar region     Problem List There are no problems to display for this patient.   Jerl Mina ,PT, DPT, E-RYT  05/18/2020, 11:04 AM  [place] MAIN [facility] SERVICES [address] [place], [place], [address] Phone: [phone]   Fax:  [phone]  Name: Ricky Benson MRN: [id number] Date of Birth: 10-10-1995

## 2020-05-26 ENCOUNTER — Other Ambulatory Visit: Payer: Self-pay

## 2020-05-26 ENCOUNTER — Ambulatory Visit: Payer: BC Managed Care – PPO | Attending: General Surgery | Admitting: Physical Therapy

## 2020-05-26 DIAGNOSIS — M6289 Other specified disorders of muscle: Secondary | ICD-10-CM | POA: Insufficient documentation

## 2020-05-26 DIAGNOSIS — G8929 Other chronic pain: Secondary | ICD-10-CM | POA: Diagnosis present

## 2020-05-26 DIAGNOSIS — M4125 Other idiopathic scoliosis, thoracolumbar region: Secondary | ICD-10-CM | POA: Diagnosis present

## 2020-05-26 DIAGNOSIS — M546 Pain in thoracic spine: Secondary | ICD-10-CM | POA: Diagnosis present

## 2020-05-26 DIAGNOSIS — M545 Low back pain, unspecified: Secondary | ICD-10-CM | POA: Insufficient documentation

## 2020-05-26 DIAGNOSIS — R279 Unspecified lack of coordination: Secondary | ICD-10-CM | POA: Insufficient documentation

## 2020-05-26 NOTE — Therapy (Signed)
[place] MAIN [facility] SERVICES [address] [place], [place], [address] Phone: [phone]   Fax:  [phone]  Physical Therapy Treatment  Patient Details  Name: ROBBIN ESCHER MRN: [id number] Date of Birth: Sep 01, 1995 Referring Provider (PT): Byrnette    Encounter Date: 05/26/2020   PT End of Session - 05/26/20 1052    Visit Number 11    Number of Visits --   PN Visit 8   Date for PT Re-Evaluation 07/06/20    PT Start Time 1000    PT Stop Time 1053    PT Time Calculation (min) 53 min    Activity Tolerance Patient tolerated treatment well    Behavior During Therapy [facility] for tasks assessed/performed           Past Medical History:  Diagnosis Date  . Chronic tonsillitis and adenoiditis     Past Surgical History:  Procedure Laterality Date  .  hip Bilateral 2021   laproscopy for FAI   . NO PAST SURGERIES    . TONSILLECTOMY AND ADENOIDECTOMY N/A 05/16/2016   Procedure: TONSILLECTOMY AND ADENOIDECTOMY;  Surgeon: Beverly Gust, MD;  Location: [place];  Service: ENT;  Laterality: N/A;  . WISDOM TOOTH EXTRACTION Bilateral 2019    There were no vitals filed for this visit.   Subjective Assessment - 05/26/20 1003    Subjective Pt notices he has snapping hip sensation on R hip when getting out of a chair or lowering his R leg or lying on his back and pressing leg like in a leg press position. Pt notices when he is not warmed up, when he is fatigued, when has to sit for long periods, he notices his inner thigh / groin R area to be a burning pain/ tightness. Pt feels he needs to warm up and stretch more.  This snapping sensation has been a problem since he was a teenage. It decreased after FAI surgery ( close to one year ago)  but it has not gone completely gone away. Now it is happening atleast 40% of the time.    Pertinent History FAI surgery BLE in 2021 and currently with PT in [place] since Feb to current.  Hx of hip and  back pain since 18 years.  Pt had Sx from bottom of ribcage to top of hipbone/ QL and anterior thigh/ groin BLE. Pain occured with lifting leg in marching position, biking, and running.  Initially Dx impingement  on R and then L.  Biking is better after surgery and PT. Running is not cleared for yet. Pain now after surgery occurs with sitting and standing for too long ( > 63mn) with inner thigh tightness.  Hx of fall onto tailbone in middle school. Denied injuries to ankle/ knees.    Patient Stated Goals to improve Sx and overall movement and return to activities: exercise, running, lifting, and gain an understanding to return to his pattern              [facility] - 05/26/20 1032      Other:   Other/ Comments hamstring stretch with hip add/IR /  poor awareness of knee alignment and recreated "snapping sensation"                         [facility] Adult PT Treatment/Exercise - 05/26/20 1053      Therapeutic Activites    Other Therapeutic Activities provided stretching sequence for global hip muscles ,  explained the importance of warm up and cool down stretches, alignment of knee on elliptical, pay attention to alignment when lowering leg, getting out of chair to minimize hip add/ IR to minimize hip / groin pain. Active listening provided about management of "snapping" sensation post FAI surgery      Neuro Re-ed    Neuro Re-ed Details  cued for proper alignment in hamstring stretch to minimze groin pain, hip add/ IR                       PT Long Term Goals - 05/26/20 1059      PT LONG TERM GOAL #1   Title Pt will report decreased clear fecal seepage from daily to 3 x week in order to return to functional activities    Time 6    Period Weeks    Status On-going      PT LONG TERM GOAL #2   Title Pt will demo proper deep core coordination  in order straining abdomen and pelvic floor to improve IAP    Time 4    Period Weeks    Status Achieved      PT  LONG TERM GOAL #3   Title Pt will report decreased LBP on FOTO from 63 pts to < 93 pts in order to return ADLs and fitness ( 9/27: 94 pts)    Time 10    Period Weeks    Status Achieved      PT LONG TERM GOAL #4   Title Pt will demo no spinal deivation and more levelled shoulder height/ inferior angle in order to minimize LBP and progress to thoracolumbar strengthening exercises    Time 4    Period Weeks    Status Achieved      PT LONG TERM GOAL #5   Title Pt will demo proper coordination of deep core and strength in order to minimize downward bearing of pelvic floor and minimize  clear fecal seepage    Time 6    Period Weeks    Status Achieved      PT LONG TERM GOAL #6   Title Pt will demo no pelvic floor mm tightness and demo proper lengthening in order to minimize leakage    Time 10    Period Weeks    Status Partially Met      PT LONG TERM GOAL #7   Title Pt will be IND with modifications to fitness routine at the gym in order to minimize bearing down of pelvic floor and no ab overuse to minimize risk for leakage and    Time 10    Period Weeks    Status On-going      PT LONG TERM GOAL #8   Title Pt will improve his FOTO score for Bowel Leakage from 45 pts to > 50 pts in order to paritcipates social activities with confidence to not have to hurry to the bathroom    Time 10    Period Weeks    Status On-going                   Plan - 05/26/20 1052    Clinical Impression Statement Pt demo'd self-reflection with FAI surgery management and recognized the need to stretch more to minimize "snapping" sensation which he has had since he was a teenager. Pt recognized the surgery has helped him to decrease pain by 60%. Pt was educated on:  _ stretching sequence for global  hip muscles  _ importance of warm up and cool down stretches  _proper alignment of knee on elliptical _pay attention to alignment when lowering leg, getting out of chair to minimize hip add/ IR to minimize  hip / groin pain  Pt demo'd IND with use of strap to stretch global mm of hip and pt reported no groin pain with proper alignment of knee. Propioception training is important with pt. Pt continues to benefit from skilled PT to integrate back to fitness with less overactivity of pelvic floor. Plan to assess goals and fecal seepage Sx at next session.      Examination-Activity Limitations Stand;Sit    Stability/Clinical Decision Making Evolving/Moderate complexity    Rehab Potential Good    PT Frequency 1x / week    PT Duration --   10   PT Treatment/Interventions Therapeutic activities;Therapeutic exercise;Moist Heat;Balance training;Neuromuscular re-education;Functional mobility training;Stair training;Patient/family education;Manual techniques;Energy conservation    Consulted and Agree with Plan of Care Patient           Patient will benefit from skilled therapeutic intervention in order to improve the following deficits and impairments:  Decreased mobility,Decreased strength,Decreased scar mobility,Hypomobility,Postural dysfunction,Improper body mechanics,Pain,Decreased activity tolerance,Decreased endurance,Decreased safety awareness,Decreased coordination,Difficulty walking,Increased muscle spasms,Decreased balance  Visit Diagnosis: Unspecified lack of coordination  Chronic bilateral low back pain without sciatica  Pain in thoracic spine  Pelvic floor dysfunction  Other idiopathic scoliosis, thoracolumbar region     Problem List There are no problems to display for this patient.   Jerl Mina ,PT, DPT, E-RYT  05/26/2020, 11:00 AM  [place] MAIN [facility] SERVICES [address] [place], [place], [address] Phone: [phone]   Fax:  [phone]  Name: ABDALRAHMAN CLEMENTSON MRN: [id number] Date of Birth: [birth year]

## 2020-05-26 NOTE — Patient Instructions (Addendum)
Stretches : (Cuing provided for proper alignment)  Instructions start with Strap on R    Stretches for your legs: LAYING on Back Use upper arms and elbows for stability when pulling strap Opposite knee bent and foot firm in align with hip   Strap on ballmound:  Hip socket  _strap, L knee bent, R ballmound against strap and spread toes, rolling foot 15 deg out and in across midline.  10 reps each side   Hamstring _knee bends  10 reps  With knee pointing straight ( slightly to outside to minimize snapping sensation)      10 reps with knee pointing out towards armpit ( notice the stretch in the medial hamstring muscle)   Hip abductors ( figure 4)      Strap under R thigh, L ankle over R thigh ( stretching L glut)     5  Breaths    Adductors and pelvic floor ( [place] Baby) : Esmond Harps are wide towards armpits, sole of feet towards ceiling   IT band _scoot hips to R, cross R leg over L and straighten knee with strap on ballmound,    Quad in sidelying _strap around the ankle, pulling ankle towards buttocks  Bottom leg firm and stabilization with knee bent   __   Seated Quad/ hip flexor stretch  (lunge position)

## 2020-06-09 ENCOUNTER — Ambulatory Visit: Payer: BC Managed Care – PPO | Admitting: Physical Therapy

## 2020-06-09 ENCOUNTER — Other Ambulatory Visit: Payer: Self-pay

## 2020-06-09 DIAGNOSIS — M6289 Other specified disorders of muscle: Secondary | ICD-10-CM

## 2020-06-09 DIAGNOSIS — M546 Pain in thoracic spine: Secondary | ICD-10-CM

## 2020-06-09 DIAGNOSIS — R279 Unspecified lack of coordination: Secondary | ICD-10-CM

## 2020-06-09 DIAGNOSIS — M545 Low back pain, unspecified: Secondary | ICD-10-CM

## 2020-06-09 DIAGNOSIS — M4125 Other idiopathic scoliosis, thoracolumbar region: Secondary | ICD-10-CM

## 2020-06-09 NOTE — Therapy (Signed)
[place] MAIN [facility] SERVICES [address] [place], [place], [address] Phone: [phone]   Fax:  [phone]  Physical Therapy Treatment  Patient Details  Name: Ricky Benson MRN: [id number] Date of Birth: [birth year] Referring Provider (PT): Byrnette    Encounter Date: 06/09/2020   PT End of Session - 06/09/20 1016    Visit Number 12    Number of Visits --   PN Visit 8   Date for PT Re-Evaluation 07/06/20    PT Start Time 1000    PT Stop Time 1100    PT Time Calculation (min) 60 min    Activity Tolerance Patient tolerated treatment well    Behavior During Therapy [facility] for tasks assessed/performed           Past Medical History:  Diagnosis Date  . Chronic tonsillitis and adenoiditis     Past Surgical History:  Procedure Laterality Date  .  hip Bilateral 2021   laproscopy for FAI   . NO PAST SURGERIES    . TONSILLECTOMY AND ADENOIDECTOMY N/A 05/16/2016   Procedure: TONSILLECTOMY AND ADENOIDECTOMY;  Surgeon: Beverly Gust, MD;  Location: [place];  Service: ENT;  Laterality: N/A;  . WISDOM TOOTH EXTRACTION Bilateral 2019    There were no vitals filed for this visit.   Subjective Assessment - 06/09/20 1006    Subjective Pt notices coffee makes his Sx worse. When he avoids coffee, he is still feeling his urgency Sx. If he is sitting for a long period of time, he notices tightness around his butt. if he passess gas or goes to the bathroom.  Pt tried a supplement recently called IB Guard which was prescribed by a doctor he saw in the past and it helped. Pt also is taking probiotics. Pt is planning to take OTC supplements for IBS. Pt feels he has given himself enough time to avoid trigger foods and beverages to see if Sx have calmed down.    Pertinent History FAI surgery BLE in 2021 and currently with PT in [place] since Feb to current.  Hx of hip and back pain since 18 years.  Pt had Sx from bottom of ribcage to top of  hipbone/ QL and anterior thigh/ groin BLE. Pain occured with lifting leg in marching position, biking, and running.  Initially Dx impingement  on R and then L.  Biking is better after surgery and PT. Running is not cleared for yet. Pain now after surgery occurs with sitting and standing for too long ( > 69min) with inner thigh tightness.  Hx of fall onto tailbone in middle school. Denied injuries to ankle/ knees.    Patient Stated Goals to improve Sx and overall movement and return to activities: exercise, running, lifting, and gain an understanding to return to his pattern              [facility] PT Assessment - 06/09/20 1510      Observation/Other Assessments   Observations pigeon pose which pt implimented into stretch routine: demo'd increased lumbar lordosis, scapular protraction      Coordination   Gross Motor Movements are Fluid and Coordinated --   excessive psuhing of abdominal mm w/ deep core HEP                        [facility] Adult PT Treatment/Exercise - 06/09/20 1512      Therapeutic Activites    Other Therapeutic Activities active listening, collaborated strategies  for modifying his HEP, provided recommendation to consult nutritionist/ GI provider before taking supplements      Neuro Re-ed    Neuro Re-ed Details  cued for proper technique with deep core level 1-2                       PT Long Term Goals - 05/26/20 1059      PT LONG TERM GOAL #1   Title Pt will report decreased clear fecal seepage from daily to 3 x week in order to return to functional activities    Time 6    Period Weeks    Status On-going      PT LONG TERM GOAL #2   Title Pt will demo proper deep core coordination  in order straining abdomen and pelvic floor to improve IAP    Time 4    Period Weeks    Status Achieved      PT LONG TERM GOAL #3   Title Pt will report decreased LBP on FOTO from 63 pts to < 93 pts in order to return ADLs and fitness ( 9/27: 94 pts)    Time 10     Period Weeks    Status Achieved      PT LONG TERM GOAL #4   Title Pt will demo no spinal deivation and more levelled shoulder height/ inferior angle in order to minimize LBP and progress to thoracolumbar strengthening exercises    Time 4    Period Weeks    Status Achieved      PT LONG TERM GOAL #5   Title Pt will demo proper coordination of deep core and strength in order to minimize downward bearing of pelvic floor and minimize  clear fecal seepage    Time 6    Period Weeks    Status Achieved      PT LONG TERM GOAL #6   Title Pt will demo no pelvic floor mm tightness and demo proper lengthening in order to minimize leakage    Time 10    Period Weeks    Status Partially Met      PT LONG TERM GOAL #7   Title Pt will be IND with modifications to fitness routine at the gym in order to minimize bearing down of pelvic floor and no ab overuse to minimize risk for leakage and    Time 10    Period Weeks    Status On-going      PT LONG TERM GOAL #8   Title Pt will improve his FOTO score for Bowel Leakage from 45 pts to > 50 pts in order to paritcipates social activities with confidence to not have to hurry to the bathroom    Time 10    Period Weeks    Status On-going                 Plan - 06/09/20 1016    Clinical Impression Statement Reviewed deep core exercises and pt showed excessive straining of abdominal mm . Following Tx, pt demo'd improved technique.   Explained the rationale to select figure 4 stretch on back instead of his self-selected pigeon pose and explaind the importance of promoting less increased lumbar lordosis in his spine.    Recommended pt to consult a nutritionist and medical provider before taking supplements.  Prescription of supplements is outside the scope of physical therapy.   Plan to review his fitness routine and incorporate pelvic PT HEP to  build compliance as pt voiced he is motivated to make time for self care and to perform Pelvic HEP.    Pt continues to benefit from skilled PT    Examination-Activity Limitations Stand;Sit    Stability/Clinical Decision Making Evolving/Moderate complexity    Rehab Potential Good    PT Frequency 1x / week    PT Duration --   10   PT Treatment/Interventions Therapeutic activities;Therapeutic exercise;Moist Heat;Balance training;Neuromuscular re-education;Functional mobility training;Stair training;Patient/family education;Manual techniques;Energy conservation    Consulted and Agree with Plan of Care Patient           Patient will benefit from skilled therapeutic intervention in order to improve the following deficits and impairments:  Decreased mobility,Decreased strength,Decreased scar mobility,Hypomobility,Postural dysfunction,Improper body mechanics,Pain,Decreased activity tolerance,Decreased endurance,Decreased safety awareness,Decreased coordination,Difficulty walking,Increased muscle spasms,Decreased balance  Visit Diagnosis: Unspecified lack of coordination  Chronic bilateral low back pain without sciatica  Pain in thoracic spine  Pelvic floor dysfunction  Other idiopathic scoliosis, thoracolumbar region     Problem List There are no problems to display for this patient.   Jerl Mina 06/09/2020, 3:13 PM  [place] MAIN [facility] SERVICES [address] [place], [place], [address] Phone: [phone]   Fax:  [phone]  Name: Ricky Benson MRN: [id number] Date of Birth: Dec 14, 1995

## 2020-06-09 NOTE — Patient Instructions (Addendum)
    Consult a nutritionist and medical provider for taking supplements because this is outside the scope of physical therapy.

## 2020-06-23 ENCOUNTER — Ambulatory Visit: Payer: BC Managed Care – PPO | Attending: General Surgery | Admitting: Physical Therapy

## 2020-06-23 ENCOUNTER — Other Ambulatory Visit: Payer: Self-pay

## 2020-06-23 DIAGNOSIS — M546 Pain in thoracic spine: Secondary | ICD-10-CM | POA: Diagnosis present

## 2020-06-23 DIAGNOSIS — M4125 Other idiopathic scoliosis, thoracolumbar region: Secondary | ICD-10-CM | POA: Diagnosis present

## 2020-06-23 DIAGNOSIS — M6289 Other specified disorders of muscle: Secondary | ICD-10-CM | POA: Diagnosis present

## 2020-06-23 DIAGNOSIS — M545 Low back pain, unspecified: Secondary | ICD-10-CM | POA: Diagnosis present

## 2020-06-23 DIAGNOSIS — R279 Unspecified lack of coordination: Secondary | ICD-10-CM | POA: Diagnosis present

## 2020-06-23 DIAGNOSIS — G8929 Other chronic pain: Secondary | ICD-10-CM | POA: Insufficient documentation

## 2020-06-23 NOTE — Patient Instructions (Addendum)
See email for nutritionists referrals      For loosening low back    Hands on  counter,   Palms shoulder width apart  Minisquat postion Trunk is parallel to floor  A) Pull buttocks back to lengthen spine, knees bent  3 breaths   B) Bring R hand to the L, and stretch the R side trunk  3 breaths   Brings hands to center again Do the same to the L side stretch by placing L hand on top of R   D) Modified thread the needle R hand on L thigh, L  thigh pushing out slightly as the R hands pull in,  elbow bent and pulls to theR,  Look under L armpit   Do the same to other side  ____ Reclined twist for hips and side of the hips/ legs  Lay on your back, knees bend Scoot hips to the R , leave shoulders in place Rock knees sside to side    Scoot other side  __   Read neuroscience of pain by Duane Lope

## 2020-06-24 NOTE — Therapy (Addendum)
[name] SERVICES [address] [place], [place], [address] Phone: [phone]   Fax:  [phone]  Physical Therapy Treatment / Discharge Summary   Patient Details  Name: Ricky Benson MRN: [id number] Date of Birth: 1996/03/18 Referring Provider (PT): Byrnette    Encounter Date: 06/23/2020   PT End of Session - 06/24/20 1833    Number of Visits 13 -  PN Visit 8   Date for PT Re-Evaluation 07/06/20    PT Start Time 0905    PT Stop Time 1000    PT Time Calculation (min) 55 min    Activity Tolerance Patient tolerated treatment well    Behavior During Therapy [facility] for tasks assessed/performed           Past Medical History:  Diagnosis Date  . Chronic tonsillitis and adenoiditis     Past Surgical History:  Procedure Laterality Date  .  hip Bilateral 2021   laproscopy for FAI   . NO PAST SURGERIES    . TONSILLECTOMY AND ADENOIDECTOMY N/A 05/16/2016   Procedure: TONSILLECTOMY AND ADENOIDECTOMY;  Surgeon: Beverly Gust, MD;  Location: [place];  Service: ENT;  Laterality: N/A;  . WISDOM TOOTH EXTRACTION Bilateral 2019    There were no vitals filed for this visit.   Subjective Assessment - 06/24/20 1830    Subjective Pt reports he is hurting his hips and low back pain from increased sitting with classes. Pt thinks the deep core exercises helps with most every day for the past 2 weeks. Pelvic tightness is related to if he is anxious or if his stomach is upset. Pt notices that he is relaxed after performing the deep core exercises but it is temporary. Pt has been in a lot of pain for a long while and he has been internalizing it. Pt can wake up in the morning and had not had anything to eat. Pt notices when stomach is upset , he experiences abdominal pain. "I am going to take your recommendation to follow up with my primary doctor and go from there."  "I have been really down and at times really frustrated with this."  Pt  feels his R LBP and hip  even since he had FAI surgery.  Pt's bowel movements occur once a day and working to not strain.     Pertinent History FAI surgery BLE in 2021 and currently with PT in [place] since Feb to current.  Hx of hip and back pain since 18 years.  Pt had Sx from bottom of ribcage to top of hipbone/ QL and anterior thigh/ groin BLE. Pain occured with lifting leg in marching position, biking, and running.  Initially Dx impingement  on R and then L.  Biking is better after surgery and PT. Running is not cleared for yet. Pain now after surgery occurs with sitting and standing for too long ( > 70mn) with inner thigh tightness.  Hx of fall onto tailbone in middle school. Denied injuries to ankle/ knees.    Patient Stated Goals to improve Sx and overall movement and return to activities: exercise, running, lifting, and gain an understanding to return to his pattern              [facility]      Observation/Other Assessments   Observations shoulders / pelvic girdle levelled , less shoulder IR on L LE    Scoliosis levelled pelvic girdle no rotation, no deviation of spine  Strength   Overall Strength Comments PF 25 rep B,  DF EV L 4+/5, 4+/5 R      Palpation   SI assessment  levelled pelvic girdle no rotation, no deviation of spine                         [facility] Adult PT Treatment/Exercise - 06/24/20 1827      Therapeutic Activites    Other Therapeutic Activities acnswered pt's questions, discussed continuation of HEP, referral to nutritionists                       PT Long Term Goals - 06/23/20 0919      PT LONG TERM GOAL #1   Title Pt will report decreased clear fecal seepage from daily to 3 x week in order to return to functional activities    Time 6    Period Weeks    Status Not Met      PT LONG TERM GOAL #2   Title Pt will demo proper deep core coordination  in order straining abdomen and pelvic floor to  improve IAP    Time 4    Period Weeks    Status Achieved      PT LONG TERM GOAL #3   Title Pt will report decreased LBP on FOTO from 63 pts to < 93 pts in order to return ADLs and fitness ( 9/27: 94 pts, 12/7: 94 )    Time 10    Period Weeks    Status Achieved      PT LONG TERM GOAL #4   Title Pt will demo no spinal deivation and more levelled shoulder height/ inferior angle in order to minimize LBP and progress to thoracolumbar strengthening exercises    Time 4    Period Weeks    Status Achieved      PT LONG TERM GOAL #5   Title Pt will demo proper coordination of deep core and strength in order to minimize downward bearing of pelvic floor and minimize  clear fecal seepage    Time 6    Period Weeks    Status Achieved      PT LONG TERM GOAL #6   Title Pt will demo no pelvic floor mm tightness and demo proper lengthening in order to minimize leakage    Time 10    Period Weeks    Status Achieved      PT LONG TERM GOAL #7   Title Pt will be IND with modifications to fitness routine at the gym in order to minimize bearing down of pelvic floor and no ab overuse to minimize risk for leakage and    Time 10    Period Weeks    Status Achieved      PT LONG TERM GOAL #8   Title Pt will improve his FOTO score for Bowel Leakage from 45 pts to > 50 pts in order to paritcipates social activities with confidence to not have to hurry to the bathroom    Time 10    Period Weeks    Status No Met as pt reported no improvements                 Plan - 06/24/20 1827    Clinical Impression Statement Pt achieved 6/8 goals and did not meet remaining 2 goals.  Pt has demo'd improvements with more levelled pelvic girdle and shoulder alignment, no more spinal  curvatures. Pt's diastasis recti resolved. Pt demo'd more correct technique with coordination of deep core muscles after required cues to minimize straining with abdominal muscles.Pt  stated he is willing to be compliant with this exercise  as he had not been consistent with it. Pt also has added pelvic floor stretches to his fitness routine. Pt's pelvic floor mm have also decreased in tension and pt demo'd proper lengthening. Pt's lower kinetic chain also improved with stronger feet arches and he demo'd improved alignment in fitness exercise to minimize hip IR which will help with post-FAI surgery. Pt demo'd fitness exercises with less cues to correct lumbar lordosis. Pt's FOTO score for LBP improved but the score for bowel function has made no changes.   Pt has been compliant with seeking support with psychotherapist to provide guidance with his anxiety and depression related to the impact his Sx have had on his life.   Pt wanted to self-d/c at this time as pt would like return to his primary doctor and follow up about his GI symptoms. Pt was provided referrals to nutritionists.     Pt will benefit from continued approaches from a biopsychosocial and interdisplinary approaches.    Examination-Activity Limitations Stand;Sit    Stability/Clinical Decision Making Evolving/Moderate complexity    Rehab Potential Good    PT Frequency 1x / week    PT Duration --   10   PT Treatment/Interventions Therapeutic activities;Therapeutic exercise;Moist Heat;Balance training;Neuromuscular re-education;Functional mobility training;Stair training;Patient/family education;Manual techniques;Energy conservation    Consulted and Agree with Plan of Care Patient           Patient will benefit from skilled therapeutic intervention in order to improve the following deficits and impairments:  Decreased mobility,Decreased strength,Decreased scar mobility,Hypomobility,Postural dysfunction,Improper body mechanics,Pain,Decreased activity tolerance,Decreased endurance,Decreased safety awareness,Decreased coordination,Difficulty walking,Increased muscle spasms,Decreased balance  Visit Diagnosis: Unspecified lack of coordination  Chronic bilateral low back  pain without sciatica  Pain in thoracic spine  Pelvic floor dysfunction  Other idiopathic scoliosis, thoracolumbar region     Problem List There are no problems to display for this patient.   Jerl Mina ,PT, DPT, E-RYT  06/24/2020, 6:33 PM  [place] MAIN [facility] SERVICES [address] [place], [place], [address] Phone: [phone]   Fax:  [phone]  Name: OLIVIER FRAYRE MRN: [id number] Date of Birth: [birth year]

## 2020-07-07 ENCOUNTER — Encounter: Payer: BC Managed Care – PPO | Admitting: Physical Therapy

## 2020-10-11 ENCOUNTER — Ambulatory Visit
Admission: RE | Admit: 2020-10-11 | Discharge: 2020-10-11 | Disposition: A | Discharge status: Home / Self Care | Payer: BC Managed Care – PPO | Source: Ambulatory Visit | Attending: Psychiatry | Admitting: Psychiatry

## 2020-10-11 ENCOUNTER — Other Ambulatory Visit: Payer: Self-pay

## 2020-10-11 DIAGNOSIS — F419 Anxiety disorder, unspecified: Secondary | ICD-10-CM | POA: Insufficient documentation

## 2020-12-06 ENCOUNTER — Ambulatory Visit: Payer: Self-pay | Admitting: Family Medicine

## 2020-12-20 DIAGNOSIS — F411 Generalized anxiety disorder: Secondary | ICD-10-CM | POA: Diagnosis not present

## 2020-12-20 DIAGNOSIS — F322 Major depressive disorder, single episode, severe without psychotic features: Secondary | ICD-10-CM | POA: Diagnosis not present

## 2020-12-20 DIAGNOSIS — Z5181 Encounter for therapeutic drug level monitoring: Secondary | ICD-10-CM | POA: Diagnosis not present

## 2020-12-20 DIAGNOSIS — F429 Obsessive-compulsive disorder, unspecified: Secondary | ICD-10-CM | POA: Diagnosis not present

## 2020-12-22 DIAGNOSIS — Z5181 Encounter for therapeutic drug level monitoring: Secondary | ICD-10-CM | POA: Diagnosis not present

## 2020-12-22 DIAGNOSIS — F429 Obsessive-compulsive disorder, unspecified: Secondary | ICD-10-CM | POA: Diagnosis not present

## 2020-12-22 DIAGNOSIS — F411 Generalized anxiety disorder: Secondary | ICD-10-CM | POA: Diagnosis not present

## 2020-12-22 DIAGNOSIS — F322 Major depressive disorder, single episode, severe without psychotic features: Secondary | ICD-10-CM | POA: Diagnosis not present

## 2020-12-30 DIAGNOSIS — F429 Obsessive-compulsive disorder, unspecified: Secondary | ICD-10-CM | POA: Diagnosis not present

## 2020-12-30 DIAGNOSIS — F332 Major depressive disorder, recurrent severe without psychotic features: Secondary | ICD-10-CM | POA: Diagnosis not present

## 2020-12-30 DIAGNOSIS — Z0289 Encounter for other administrative examinations: Secondary | ICD-10-CM | POA: Diagnosis not present

## 2020-12-30 DIAGNOSIS — Z022 Encounter for examination for admission to residential institution: Secondary | ICD-10-CM | POA: Diagnosis not present

## 2020-12-30 DIAGNOSIS — F411 Generalized anxiety disorder: Secondary | ICD-10-CM | POA: Diagnosis not present

## 2021-03-03 DIAGNOSIS — F4321 Adjustment disorder with depressed mood: Secondary | ICD-10-CM | POA: Diagnosis not present

## 2021-03-08 DIAGNOSIS — F4321 Adjustment disorder with depressed mood: Secondary | ICD-10-CM | POA: Diagnosis not present

## 2021-03-16 DIAGNOSIS — F4321 Adjustment disorder with depressed mood: Secondary | ICD-10-CM | POA: Diagnosis not present

## 2021-03-17 DIAGNOSIS — F332 Major depressive disorder, recurrent severe without psychotic features: Secondary | ICD-10-CM | POA: Diagnosis not present

## 2021-03-23 DIAGNOSIS — F331 Major depressive disorder, recurrent, moderate: Secondary | ICD-10-CM | POA: Diagnosis not present

## 2021-03-30 DIAGNOSIS — F411 Generalized anxiety disorder: Secondary | ICD-10-CM | POA: Diagnosis not present

## 2021-03-31 DIAGNOSIS — F332 Major depressive disorder, recurrent severe without psychotic features: Secondary | ICD-10-CM | POA: Diagnosis not present

## 2021-04-01 DIAGNOSIS — F332 Major depressive disorder, recurrent severe without psychotic features: Secondary | ICD-10-CM | POA: Diagnosis not present

## 2021-04-05 DIAGNOSIS — F332 Major depressive disorder, recurrent severe without psychotic features: Secondary | ICD-10-CM | POA: Diagnosis not present

## 2021-04-06 DIAGNOSIS — F4321 Adjustment disorder with depressed mood: Secondary | ICD-10-CM | POA: Diagnosis not present

## 2021-04-06 DIAGNOSIS — F332 Major depressive disorder, recurrent severe without psychotic features: Secondary | ICD-10-CM | POA: Diagnosis not present

## 2021-04-07 DIAGNOSIS — F332 Major depressive disorder, recurrent severe without psychotic features: Secondary | ICD-10-CM | POA: Diagnosis not present

## 2021-04-08 DIAGNOSIS — F332 Major depressive disorder, recurrent severe without psychotic features: Secondary | ICD-10-CM | POA: Diagnosis not present

## 2021-04-11 DIAGNOSIS — F332 Major depressive disorder, recurrent severe without psychotic features: Secondary | ICD-10-CM | POA: Diagnosis not present

## 2021-04-12 DIAGNOSIS — F332 Major depressive disorder, recurrent severe without psychotic features: Secondary | ICD-10-CM | POA: Diagnosis not present

## 2021-04-13 DIAGNOSIS — F332 Major depressive disorder, recurrent severe without psychotic features: Secondary | ICD-10-CM | POA: Diagnosis not present

## 2021-04-13 DIAGNOSIS — F4321 Adjustment disorder with depressed mood: Secondary | ICD-10-CM | POA: Diagnosis not present

## 2021-04-18 DIAGNOSIS — F332 Major depressive disorder, recurrent severe without psychotic features: Secondary | ICD-10-CM | POA: Diagnosis not present

## 2021-04-19 DIAGNOSIS — F332 Major depressive disorder, recurrent severe without psychotic features: Secondary | ICD-10-CM | POA: Diagnosis not present

## 2021-04-20 DIAGNOSIS — F4321 Adjustment disorder with depressed mood: Secondary | ICD-10-CM | POA: Diagnosis not present

## 2021-04-20 DIAGNOSIS — F332 Major depressive disorder, recurrent severe without psychotic features: Secondary | ICD-10-CM | POA: Diagnosis not present

## 2021-04-21 DIAGNOSIS — F332 Major depressive disorder, recurrent severe without psychotic features: Secondary | ICD-10-CM | POA: Diagnosis not present

## 2021-04-25 DIAGNOSIS — F332 Major depressive disorder, recurrent severe without psychotic features: Secondary | ICD-10-CM | POA: Diagnosis not present

## 2021-04-26 DIAGNOSIS — F332 Major depressive disorder, recurrent severe without psychotic features: Secondary | ICD-10-CM | POA: Diagnosis not present

## 2021-04-27 DIAGNOSIS — F4321 Adjustment disorder with depressed mood: Secondary | ICD-10-CM | POA: Diagnosis not present

## 2021-04-27 DIAGNOSIS — F332 Major depressive disorder, recurrent severe without psychotic features: Secondary | ICD-10-CM | POA: Diagnosis not present

## 2021-04-28 DIAGNOSIS — F332 Major depressive disorder, recurrent severe without psychotic features: Secondary | ICD-10-CM | POA: Diagnosis not present

## 2021-04-29 DIAGNOSIS — F428 Other obsessive-compulsive disorder: Secondary | ICD-10-CM | POA: Diagnosis not present

## 2021-04-29 DIAGNOSIS — F329 Major depressive disorder, single episode, unspecified: Secondary | ICD-10-CM | POA: Diagnosis not present

## 2021-05-02 DIAGNOSIS — F332 Major depressive disorder, recurrent severe without psychotic features: Secondary | ICD-10-CM | POA: Diagnosis not present

## 2021-05-03 DIAGNOSIS — F332 Major depressive disorder, recurrent severe without psychotic features: Secondary | ICD-10-CM | POA: Diagnosis not present

## 2021-05-04 DIAGNOSIS — F332 Major depressive disorder, recurrent severe without psychotic features: Secondary | ICD-10-CM | POA: Diagnosis not present

## 2021-05-04 DIAGNOSIS — F4321 Adjustment disorder with depressed mood: Secondary | ICD-10-CM | POA: Diagnosis not present

## 2021-05-05 DIAGNOSIS — F332 Major depressive disorder, recurrent severe without psychotic features: Secondary | ICD-10-CM | POA: Diagnosis not present

## 2021-05-06 DIAGNOSIS — F332 Major depressive disorder, recurrent severe without psychotic features: Secondary | ICD-10-CM | POA: Diagnosis not present

## 2021-05-09 DIAGNOSIS — F332 Major depressive disorder, recurrent severe without psychotic features: Secondary | ICD-10-CM | POA: Diagnosis not present

## 2021-05-10 DIAGNOSIS — F4321 Adjustment disorder with depressed mood: Secondary | ICD-10-CM | POA: Diagnosis not present

## 2021-05-10 DIAGNOSIS — F332 Major depressive disorder, recurrent severe without psychotic features: Secondary | ICD-10-CM | POA: Diagnosis not present

## 2021-05-11 DIAGNOSIS — F332 Major depressive disorder, recurrent severe without psychotic features: Secondary | ICD-10-CM | POA: Diagnosis not present

## 2021-05-12 DIAGNOSIS — F332 Major depressive disorder, recurrent severe without psychotic features: Secondary | ICD-10-CM | POA: Diagnosis not present

## 2021-05-13 DIAGNOSIS — F332 Major depressive disorder, recurrent severe without psychotic features: Secondary | ICD-10-CM | POA: Diagnosis not present

## 2021-05-17 DIAGNOSIS — F332 Major depressive disorder, recurrent severe without psychotic features: Secondary | ICD-10-CM | POA: Diagnosis not present

## 2021-05-18 DIAGNOSIS — F332 Major depressive disorder, recurrent severe without psychotic features: Secondary | ICD-10-CM | POA: Diagnosis not present

## 2021-05-19 DIAGNOSIS — F332 Major depressive disorder, recurrent severe without psychotic features: Secondary | ICD-10-CM | POA: Diagnosis not present

## 2021-05-20 DIAGNOSIS — F332 Major depressive disorder, recurrent severe without psychotic features: Secondary | ICD-10-CM | POA: Diagnosis not present

## 2021-05-24 DIAGNOSIS — F332 Major depressive disorder, recurrent severe without psychotic features: Secondary | ICD-10-CM | POA: Diagnosis not present

## 2021-05-25 DIAGNOSIS — F4321 Adjustment disorder with depressed mood: Secondary | ICD-10-CM | POA: Diagnosis not present

## 2021-05-25 DIAGNOSIS — F332 Major depressive disorder, recurrent severe without psychotic features: Secondary | ICD-10-CM | POA: Diagnosis not present

## 2021-05-26 DIAGNOSIS — F332 Major depressive disorder, recurrent severe without psychotic features: Secondary | ICD-10-CM | POA: Diagnosis not present

## 2021-05-30 DIAGNOSIS — F332 Major depressive disorder, recurrent severe without psychotic features: Secondary | ICD-10-CM | POA: Diagnosis not present

## 2021-05-31 DIAGNOSIS — F332 Major depressive disorder, recurrent severe without psychotic features: Secondary | ICD-10-CM | POA: Diagnosis not present

## 2021-06-01 DIAGNOSIS — F4321 Adjustment disorder with depressed mood: Secondary | ICD-10-CM | POA: Diagnosis not present

## 2021-06-03 DIAGNOSIS — F332 Major depressive disorder, recurrent severe without psychotic features: Secondary | ICD-10-CM | POA: Diagnosis not present

## 2021-06-08 DIAGNOSIS — F4321 Adjustment disorder with depressed mood: Secondary | ICD-10-CM | POA: Diagnosis not present

## 2021-06-08 DIAGNOSIS — R110 Nausea: Secondary | ICD-10-CM | POA: Diagnosis not present

## 2021-06-08 DIAGNOSIS — R150 Incomplete defecation: Secondary | ICD-10-CM | POA: Diagnosis not present

## 2021-06-08 DIAGNOSIS — F329 Major depressive disorder, single episode, unspecified: Secondary | ICD-10-CM | POA: Diagnosis not present

## 2021-06-08 DIAGNOSIS — F428 Other obsessive-compulsive disorder: Secondary | ICD-10-CM | POA: Diagnosis not present

## 2021-06-15 DIAGNOSIS — F4321 Adjustment disorder with depressed mood: Secondary | ICD-10-CM | POA: Diagnosis not present

## 2021-06-20 DIAGNOSIS — F428 Other obsessive-compulsive disorder: Secondary | ICD-10-CM | POA: Diagnosis not present

## 2021-06-20 DIAGNOSIS — F329 Major depressive disorder, single episode, unspecified: Secondary | ICD-10-CM | POA: Diagnosis not present

## 2021-06-20 DIAGNOSIS — R110 Nausea: Secondary | ICD-10-CM | POA: Diagnosis not present

## 2021-06-22 DIAGNOSIS — F4321 Adjustment disorder with depressed mood: Secondary | ICD-10-CM | POA: Diagnosis not present

## 2021-06-29 DIAGNOSIS — F4321 Adjustment disorder with depressed mood: Secondary | ICD-10-CM | POA: Diagnosis not present

## 2021-06-30 DIAGNOSIS — F332 Major depressive disorder, recurrent severe without psychotic features: Secondary | ICD-10-CM | POA: Diagnosis not present

## 2021-07-13 DIAGNOSIS — F4321 Adjustment disorder with depressed mood: Secondary | ICD-10-CM | POA: Diagnosis not present

## 2021-07-20 DIAGNOSIS — F4321 Adjustment disorder with depressed mood: Secondary | ICD-10-CM | POA: Diagnosis not present

## 2021-07-27 DIAGNOSIS — F4321 Adjustment disorder with depressed mood: Secondary | ICD-10-CM | POA: Diagnosis not present

## 2021-08-03 DIAGNOSIS — F4321 Adjustment disorder with depressed mood: Secondary | ICD-10-CM | POA: Diagnosis not present

## 2021-08-04 DIAGNOSIS — L700 Acne vulgaris: Secondary | ICD-10-CM | POA: Diagnosis not present

## 2021-08-04 DIAGNOSIS — L538 Other specified erythematous conditions: Secondary | ICD-10-CM | POA: Diagnosis not present

## 2021-08-04 DIAGNOSIS — R238 Other skin changes: Secondary | ICD-10-CM | POA: Diagnosis not present

## 2021-08-04 DIAGNOSIS — B078 Other viral warts: Secondary | ICD-10-CM | POA: Diagnosis not present

## 2021-08-24 DIAGNOSIS — F4321 Adjustment disorder with depressed mood: Secondary | ICD-10-CM | POA: Diagnosis not present

## 2021-08-31 DIAGNOSIS — F4321 Adjustment disorder with depressed mood: Secondary | ICD-10-CM | POA: Diagnosis not present

## 2021-09-07 DIAGNOSIS — F4321 Adjustment disorder with depressed mood: Secondary | ICD-10-CM | POA: Diagnosis not present

## 2021-09-21 DIAGNOSIS — F4321 Adjustment disorder with depressed mood: Secondary | ICD-10-CM | POA: Diagnosis not present

## 2021-10-05 DIAGNOSIS — F4321 Adjustment disorder with depressed mood: Secondary | ICD-10-CM | POA: Diagnosis not present

## 2021-10-19 DIAGNOSIS — F4321 Adjustment disorder with depressed mood: Secondary | ICD-10-CM | POA: Diagnosis not present

## 2021-11-09 DIAGNOSIS — F331 Major depressive disorder, recurrent, moderate: Secondary | ICD-10-CM | POA: Diagnosis not present

## 2021-12-21 DIAGNOSIS — F4321 Adjustment disorder with depressed mood: Secondary | ICD-10-CM | POA: Diagnosis not present

## 2022-01-04 DIAGNOSIS — F4321 Adjustment disorder with depressed mood: Secondary | ICD-10-CM | POA: Diagnosis not present

## 2022-01-18 DIAGNOSIS — F4321 Adjustment disorder with depressed mood: Secondary | ICD-10-CM | POA: Diagnosis not present

## 2022-02-01 DIAGNOSIS — F4321 Adjustment disorder with depressed mood: Secondary | ICD-10-CM | POA: Diagnosis not present

## 2022-02-08 DIAGNOSIS — F4321 Adjustment disorder with depressed mood: Secondary | ICD-10-CM | POA: Diagnosis not present

## 2022-02-10 DIAGNOSIS — F411 Generalized anxiety disorder: Secondary | ICD-10-CM | POA: Diagnosis not present

## 2022-02-10 DIAGNOSIS — R150 Incomplete defecation: Secondary | ICD-10-CM | POA: Diagnosis not present

## 2022-02-10 DIAGNOSIS — R110 Nausea: Secondary | ICD-10-CM | POA: Diagnosis not present

## 2022-02-15 DIAGNOSIS — F411 Generalized anxiety disorder: Secondary | ICD-10-CM | POA: Diagnosis not present

## 2022-02-16 DIAGNOSIS — L700 Acne vulgaris: Secondary | ICD-10-CM | POA: Diagnosis not present

## 2022-02-22 DIAGNOSIS — H5213 Myopia, bilateral: Secondary | ICD-10-CM | POA: Diagnosis not present

## 2022-02-22 DIAGNOSIS — F411 Generalized anxiety disorder: Secondary | ICD-10-CM | POA: Diagnosis not present

## 2022-03-01 DIAGNOSIS — F4321 Adjustment disorder with depressed mood: Secondary | ICD-10-CM | POA: Diagnosis not present

## 2022-03-02 DIAGNOSIS — F4321 Adjustment disorder with depressed mood: Secondary | ICD-10-CM | POA: Diagnosis not present

## 2022-03-06 DIAGNOSIS — F411 Generalized anxiety disorder: Secondary | ICD-10-CM | POA: Diagnosis not present

## 2022-03-06 DIAGNOSIS — F309 Manic episode, unspecified: Secondary | ICD-10-CM | POA: Diagnosis not present

## 2022-03-06 DIAGNOSIS — G4700 Insomnia, unspecified: Secondary | ICD-10-CM | POA: Diagnosis not present

## 2022-03-08 DIAGNOSIS — F4321 Adjustment disorder with depressed mood: Secondary | ICD-10-CM | POA: Diagnosis not present

## 2022-03-09 DIAGNOSIS — F411 Generalized anxiety disorder: Secondary | ICD-10-CM | POA: Diagnosis not present

## 2022-03-09 DIAGNOSIS — F1994 Other psychoactive substance use, unspecified with psychoactive substance-induced mood disorder: Secondary | ICD-10-CM | POA: Diagnosis not present

## 2022-03-09 DIAGNOSIS — F309 Manic episode, unspecified: Secondary | ICD-10-CM | POA: Diagnosis not present

## 2022-03-09 DIAGNOSIS — G4700 Insomnia, unspecified: Secondary | ICD-10-CM | POA: Diagnosis not present

## 2022-03-10 DIAGNOSIS — F411 Generalized anxiety disorder: Secondary | ICD-10-CM | POA: Diagnosis not present

## 2022-03-10 DIAGNOSIS — G4700 Insomnia, unspecified: Secondary | ICD-10-CM | POA: Diagnosis not present

## 2022-03-10 DIAGNOSIS — F309 Manic episode, unspecified: Secondary | ICD-10-CM | POA: Diagnosis not present

## 2022-03-10 DIAGNOSIS — F329 Major depressive disorder, single episode, unspecified: Secondary | ICD-10-CM | POA: Diagnosis not present

## 2022-03-14 DIAGNOSIS — F309 Manic episode, unspecified: Secondary | ICD-10-CM | POA: Diagnosis not present

## 2022-03-14 DIAGNOSIS — F4321 Adjustment disorder with depressed mood: Secondary | ICD-10-CM | POA: Diagnosis not present

## 2022-03-14 DIAGNOSIS — F1994 Other psychoactive substance use, unspecified with psychoactive substance-induced mood disorder: Secondary | ICD-10-CM | POA: Diagnosis not present

## 2022-03-15 DIAGNOSIS — F4321 Adjustment disorder with depressed mood: Secondary | ICD-10-CM | POA: Diagnosis not present

## 2022-03-17 DIAGNOSIS — F1994 Other psychoactive substance use, unspecified with psychoactive substance-induced mood disorder: Secondary | ICD-10-CM | POA: Diagnosis not present

## 2022-03-17 DIAGNOSIS — G4700 Insomnia, unspecified: Secondary | ICD-10-CM | POA: Diagnosis not present

## 2022-03-17 DIAGNOSIS — F411 Generalized anxiety disorder: Secondary | ICD-10-CM | POA: Diagnosis not present

## 2022-03-17 DIAGNOSIS — F309 Manic episode, unspecified: Secondary | ICD-10-CM | POA: Diagnosis not present

## 2022-03-21 DIAGNOSIS — F4321 Adjustment disorder with depressed mood: Secondary | ICD-10-CM | POA: Diagnosis not present

## 2022-03-22 DIAGNOSIS — F4321 Adjustment disorder with depressed mood: Secondary | ICD-10-CM | POA: Diagnosis not present

## 2022-03-23 DIAGNOSIS — F411 Generalized anxiety disorder: Secondary | ICD-10-CM | POA: Diagnosis not present

## 2022-03-23 DIAGNOSIS — F309 Manic episode, unspecified: Secondary | ICD-10-CM | POA: Diagnosis not present

## 2022-03-23 DIAGNOSIS — F1994 Other psychoactive substance use, unspecified with psychoactive substance-induced mood disorder: Secondary | ICD-10-CM | POA: Diagnosis not present

## 2022-03-23 DIAGNOSIS — F428 Other obsessive-compulsive disorder: Secondary | ICD-10-CM | POA: Diagnosis not present

## 2022-03-29 DIAGNOSIS — F4321 Adjustment disorder with depressed mood: Secondary | ICD-10-CM | POA: Diagnosis not present

## 2022-03-30 DIAGNOSIS — F4321 Adjustment disorder with depressed mood: Secondary | ICD-10-CM | POA: Diagnosis not present

## 2022-04-03 DIAGNOSIS — F4321 Adjustment disorder with depressed mood: Secondary | ICD-10-CM | POA: Diagnosis not present

## 2022-04-05 DIAGNOSIS — F4321 Adjustment disorder with depressed mood: Secondary | ICD-10-CM | POA: Diagnosis not present

## 2022-04-06 DIAGNOSIS — F1994 Other psychoactive substance use, unspecified with psychoactive substance-induced mood disorder: Secondary | ICD-10-CM | POA: Diagnosis not present

## 2022-04-06 DIAGNOSIS — F411 Generalized anxiety disorder: Secondary | ICD-10-CM | POA: Diagnosis not present

## 2022-04-06 DIAGNOSIS — F309 Manic episode, unspecified: Secondary | ICD-10-CM | POA: Diagnosis not present

## 2022-04-17 DIAGNOSIS — F309 Manic episode, unspecified: Secondary | ICD-10-CM | POA: Diagnosis not present

## 2022-04-17 DIAGNOSIS — F1994 Other psychoactive substance use, unspecified with psychoactive substance-induced mood disorder: Secondary | ICD-10-CM | POA: Diagnosis not present

## 2022-04-18 DIAGNOSIS — F4321 Adjustment disorder with depressed mood: Secondary | ICD-10-CM | POA: Diagnosis not present

## 2022-04-19 DIAGNOSIS — F4321 Adjustment disorder with depressed mood: Secondary | ICD-10-CM | POA: Diagnosis not present

## 2022-04-24 DIAGNOSIS — F4321 Adjustment disorder with depressed mood: Secondary | ICD-10-CM | POA: Diagnosis not present

## 2022-04-27 DIAGNOSIS — F4321 Adjustment disorder with depressed mood: Secondary | ICD-10-CM | POA: Diagnosis not present

## 2022-05-01 DIAGNOSIS — F4321 Adjustment disorder with depressed mood: Secondary | ICD-10-CM | POA: Diagnosis not present

## 2022-05-01 DIAGNOSIS — F1994 Other psychoactive substance use, unspecified with psychoactive substance-induced mood disorder: Secondary | ICD-10-CM | POA: Diagnosis not present

## 2022-05-01 DIAGNOSIS — F309 Manic episode, unspecified: Secondary | ICD-10-CM | POA: Diagnosis not present

## 2022-05-08 DIAGNOSIS — F4321 Adjustment disorder with depressed mood: Secondary | ICD-10-CM | POA: Diagnosis not present

## 2022-05-09 DIAGNOSIS — F4321 Adjustment disorder with depressed mood: Secondary | ICD-10-CM | POA: Diagnosis not present

## 2022-06-01 DIAGNOSIS — F4321 Adjustment disorder with depressed mood: Secondary | ICD-10-CM | POA: Diagnosis not present

## 2022-06-06 DIAGNOSIS — F4321 Adjustment disorder with depressed mood: Secondary | ICD-10-CM | POA: Diagnosis not present

## 2022-06-14 DIAGNOSIS — F4321 Adjustment disorder with depressed mood: Secondary | ICD-10-CM | POA: Diagnosis not present

## 2022-06-20 DIAGNOSIS — F4321 Adjustment disorder with depressed mood: Secondary | ICD-10-CM | POA: Diagnosis not present

## 2022-06-27 DIAGNOSIS — F4321 Adjustment disorder with depressed mood: Secondary | ICD-10-CM | POA: Diagnosis not present

## 2022-07-06 DIAGNOSIS — F411 Generalized anxiety disorder: Secondary | ICD-10-CM | POA: Diagnosis not present

## 2022-07-06 DIAGNOSIS — F4321 Adjustment disorder with depressed mood: Secondary | ICD-10-CM | POA: Diagnosis not present

## 2022-07-06 DIAGNOSIS — F428 Other obsessive-compulsive disorder: Secondary | ICD-10-CM | POA: Diagnosis not present

## 2022-07-06 DIAGNOSIS — F331 Major depressive disorder, recurrent, moderate: Secondary | ICD-10-CM | POA: Diagnosis not present

## 2022-07-06 DIAGNOSIS — R150 Incomplete defecation: Secondary | ICD-10-CM | POA: Diagnosis not present

## 2022-07-10 DIAGNOSIS — F4321 Adjustment disorder with depressed mood: Secondary | ICD-10-CM | POA: Diagnosis not present

## 2022-08-01 DIAGNOSIS — F4310 Post-traumatic stress disorder, unspecified: Secondary | ICD-10-CM | POA: Diagnosis not present

## 2022-08-07 DIAGNOSIS — F319 Bipolar disorder, unspecified: Secondary | ICD-10-CM | POA: Diagnosis not present

## 2022-08-09 DIAGNOSIS — F4310 Post-traumatic stress disorder, unspecified: Secondary | ICD-10-CM | POA: Diagnosis not present

## 2022-08-15 DIAGNOSIS — M79602 Pain in left arm: Secondary | ICD-10-CM | POA: Diagnosis not present

## 2022-08-15 DIAGNOSIS — M542 Cervicalgia: Secondary | ICD-10-CM | POA: Diagnosis not present

## 2022-08-15 DIAGNOSIS — M25512 Pain in left shoulder: Secondary | ICD-10-CM | POA: Diagnosis not present

## 2022-08-16 DIAGNOSIS — F411 Generalized anxiety disorder: Secondary | ICD-10-CM | POA: Diagnosis not present

## 2022-08-21 DIAGNOSIS — F900 Attention-deficit hyperactivity disorder, predominantly inattentive type: Secondary | ICD-10-CM | POA: Diagnosis not present

## 2022-08-21 DIAGNOSIS — F3132 Bipolar disorder, current episode depressed, moderate: Secondary | ICD-10-CM | POA: Diagnosis not present

## 2022-08-23 DIAGNOSIS — F411 Generalized anxiety disorder: Secondary | ICD-10-CM | POA: Diagnosis not present

## 2022-08-30 DIAGNOSIS — F411 Generalized anxiety disorder: Secondary | ICD-10-CM | POA: Diagnosis not present

## 2022-09-01 DIAGNOSIS — F319 Bipolar disorder, unspecified: Secondary | ICD-10-CM | POA: Diagnosis not present

## 2022-09-05 DIAGNOSIS — R150 Incomplete defecation: Secondary | ICD-10-CM | POA: Diagnosis not present

## 2022-09-05 DIAGNOSIS — F3132 Bipolar disorder, current episode depressed, moderate: Secondary | ICD-10-CM | POA: Diagnosis not present

## 2022-09-05 DIAGNOSIS — F411 Generalized anxiety disorder: Secondary | ICD-10-CM | POA: Diagnosis not present

## 2022-09-05 DIAGNOSIS — F900 Attention-deficit hyperactivity disorder, predominantly inattentive type: Secondary | ICD-10-CM | POA: Diagnosis not present

## 2022-09-12 DIAGNOSIS — M25512 Pain in left shoulder: Secondary | ICD-10-CM | POA: Diagnosis not present

## 2022-09-12 DIAGNOSIS — F4321 Adjustment disorder with depressed mood: Secondary | ICD-10-CM | POA: Diagnosis not present

## 2022-09-12 DIAGNOSIS — M4722 Other spondylosis with radiculopathy, cervical region: Secondary | ICD-10-CM | POA: Diagnosis not present

## 2022-09-13 DIAGNOSIS — F3132 Bipolar disorder, current episode depressed, moderate: Secondary | ICD-10-CM | POA: Diagnosis not present

## 2022-09-19 DIAGNOSIS — R150 Incomplete defecation: Secondary | ICD-10-CM | POA: Diagnosis not present

## 2022-09-19 DIAGNOSIS — F900 Attention-deficit hyperactivity disorder, predominantly inattentive type: Secondary | ICD-10-CM | POA: Diagnosis not present

## 2022-09-19 DIAGNOSIS — F3132 Bipolar disorder, current episode depressed, moderate: Secondary | ICD-10-CM | POA: Diagnosis not present

## 2022-09-19 DIAGNOSIS — F411 Generalized anxiety disorder: Secondary | ICD-10-CM | POA: Diagnosis not present

## 2022-09-25 ENCOUNTER — Other Ambulatory Visit: Payer: Self-pay | Admitting: Student

## 2022-09-25 DIAGNOSIS — F4321 Adjustment disorder with depressed mood: Secondary | ICD-10-CM | POA: Diagnosis not present

## 2022-09-25 DIAGNOSIS — M4722 Other spondylosis with radiculopathy, cervical region: Secondary | ICD-10-CM

## 2022-09-27 DIAGNOSIS — F3132 Bipolar disorder, current episode depressed, moderate: Secondary | ICD-10-CM | POA: Diagnosis not present

## 2022-09-27 DIAGNOSIS — F900 Attention-deficit hyperactivity disorder, predominantly inattentive type: Secondary | ICD-10-CM | POA: Diagnosis not present

## 2022-09-28 DIAGNOSIS — F3132 Bipolar disorder, current episode depressed, moderate: Secondary | ICD-10-CM | POA: Diagnosis not present

## 2022-09-28 DIAGNOSIS — F4321 Adjustment disorder with depressed mood: Secondary | ICD-10-CM | POA: Diagnosis not present

## 2022-09-28 DIAGNOSIS — F900 Attention-deficit hyperactivity disorder, predominantly inattentive type: Secondary | ICD-10-CM | POA: Diagnosis not present

## 2022-09-29 ENCOUNTER — Other Ambulatory Visit: Payer: Self-pay | Admitting: Student

## 2022-09-29 DIAGNOSIS — M25512 Pain in left shoulder: Secondary | ICD-10-CM

## 2022-10-02 ENCOUNTER — Ambulatory Visit
Admission: RE | Admit: 2022-10-02 | Discharge: 2022-10-02 | Disposition: A | Discharge status: Home / Self Care | Payer: BC Managed Care – PPO | Source: Ambulatory Visit | Attending: Student | Admitting: Student

## 2022-10-02 DIAGNOSIS — M25512 Pain in left shoulder: Secondary | ICD-10-CM | POA: Diagnosis not present

## 2022-10-02 DIAGNOSIS — M4722 Other spondylosis with radiculopathy, cervical region: Secondary | ICD-10-CM

## 2022-10-02 DIAGNOSIS — R202 Paresthesia of skin: Secondary | ICD-10-CM | POA: Diagnosis not present

## 2022-10-02 DIAGNOSIS — F900 Attention-deficit hyperactivity disorder, predominantly inattentive type: Secondary | ICD-10-CM | POA: Diagnosis not present

## 2022-10-02 DIAGNOSIS — M5412 Radiculopathy, cervical region: Secondary | ICD-10-CM | POA: Diagnosis not present

## 2022-10-02 DIAGNOSIS — M47812 Spondylosis without myelopathy or radiculopathy, cervical region: Secondary | ICD-10-CM | POA: Diagnosis not present

## 2022-10-02 DIAGNOSIS — F3132 Bipolar disorder, current episode depressed, moderate: Secondary | ICD-10-CM | POA: Diagnosis not present

## 2022-10-03 DIAGNOSIS — F4321 Adjustment disorder with depressed mood: Secondary | ICD-10-CM | POA: Diagnosis not present

## 2022-10-04 ENCOUNTER — Other Ambulatory Visit: Payer: BC Managed Care – PPO

## 2022-10-04 DIAGNOSIS — F900 Attention-deficit hyperactivity disorder, predominantly inattentive type: Secondary | ICD-10-CM | POA: Diagnosis not present

## 2022-10-04 DIAGNOSIS — F3132 Bipolar disorder, current episode depressed, moderate: Secondary | ICD-10-CM | POA: Diagnosis not present

## 2022-10-06 DIAGNOSIS — F3132 Bipolar disorder, current episode depressed, moderate: Secondary | ICD-10-CM | POA: Diagnosis not present

## 2022-10-06 DIAGNOSIS — F900 Attention-deficit hyperactivity disorder, predominantly inattentive type: Secondary | ICD-10-CM | POA: Diagnosis not present

## 2022-10-09 DIAGNOSIS — F900 Attention-deficit hyperactivity disorder, predominantly inattentive type: Secondary | ICD-10-CM | POA: Diagnosis not present

## 2022-10-09 DIAGNOSIS — F3132 Bipolar disorder, current episode depressed, moderate: Secondary | ICD-10-CM | POA: Diagnosis not present

## 2022-10-11 DIAGNOSIS — F3132 Bipolar disorder, current episode depressed, moderate: Secondary | ICD-10-CM | POA: Diagnosis not present

## 2022-10-11 DIAGNOSIS — F900 Attention-deficit hyperactivity disorder, predominantly inattentive type: Secondary | ICD-10-CM | POA: Diagnosis not present

## 2022-10-13 DIAGNOSIS — F900 Attention-deficit hyperactivity disorder, predominantly inattentive type: Secondary | ICD-10-CM | POA: Diagnosis not present

## 2022-10-13 DIAGNOSIS — F3132 Bipolar disorder, current episode depressed, moderate: Secondary | ICD-10-CM | POA: Diagnosis not present

## 2022-10-23 DIAGNOSIS — F900 Attention-deficit hyperactivity disorder, predominantly inattentive type: Secondary | ICD-10-CM | POA: Diagnosis not present

## 2022-10-23 DIAGNOSIS — F3132 Bipolar disorder, current episode depressed, moderate: Secondary | ICD-10-CM | POA: Diagnosis not present

## 2022-10-25 DIAGNOSIS — F3132 Bipolar disorder, current episode depressed, moderate: Secondary | ICD-10-CM | POA: Diagnosis not present

## 2022-10-25 DIAGNOSIS — F900 Attention-deficit hyperactivity disorder, predominantly inattentive type: Secondary | ICD-10-CM | POA: Diagnosis not present

## 2022-10-27 DIAGNOSIS — F900 Attention-deficit hyperactivity disorder, predominantly inattentive type: Secondary | ICD-10-CM | POA: Diagnosis not present

## 2022-10-27 DIAGNOSIS — F3132 Bipolar disorder, current episode depressed, moderate: Secondary | ICD-10-CM | POA: Diagnosis not present

## 2022-10-30 DIAGNOSIS — F3132 Bipolar disorder, current episode depressed, moderate: Secondary | ICD-10-CM | POA: Diagnosis not present

## 2022-10-30 DIAGNOSIS — F900 Attention-deficit hyperactivity disorder, predominantly inattentive type: Secondary | ICD-10-CM | POA: Diagnosis not present

## 2022-11-01 DIAGNOSIS — F900 Attention-deficit hyperactivity disorder, predominantly inattentive type: Secondary | ICD-10-CM | POA: Diagnosis not present

## 2022-11-01 DIAGNOSIS — F3132 Bipolar disorder, current episode depressed, moderate: Secondary | ICD-10-CM | POA: Diagnosis not present

## 2022-11-03 DIAGNOSIS — F3132 Bipolar disorder, current episode depressed, moderate: Secondary | ICD-10-CM | POA: Diagnosis not present

## 2022-11-03 DIAGNOSIS — F900 Attention-deficit hyperactivity disorder, predominantly inattentive type: Secondary | ICD-10-CM | POA: Diagnosis not present

## 2022-11-06 DIAGNOSIS — F3132 Bipolar disorder, current episode depressed, moderate: Secondary | ICD-10-CM | POA: Diagnosis not present

## 2022-11-06 DIAGNOSIS — F900 Attention-deficit hyperactivity disorder, predominantly inattentive type: Secondary | ICD-10-CM | POA: Diagnosis not present

## 2022-11-08 DIAGNOSIS — F3132 Bipolar disorder, current episode depressed, moderate: Secondary | ICD-10-CM | POA: Diagnosis not present

## 2022-11-08 DIAGNOSIS — F900 Attention-deficit hyperactivity disorder, predominantly inattentive type: Secondary | ICD-10-CM | POA: Diagnosis not present

## 2022-11-10 DIAGNOSIS — F900 Attention-deficit hyperactivity disorder, predominantly inattentive type: Secondary | ICD-10-CM | POA: Diagnosis not present

## 2022-11-10 DIAGNOSIS — F3132 Bipolar disorder, current episode depressed, moderate: Secondary | ICD-10-CM | POA: Diagnosis not present

## 2022-11-14 DIAGNOSIS — F4321 Adjustment disorder with depressed mood: Secondary | ICD-10-CM | POA: Diagnosis not present

## 2022-11-20 DIAGNOSIS — Z1339 Encounter for screening examination for other mental health and behavioral disorders: Secondary | ICD-10-CM | POA: Diagnosis not present

## 2022-11-20 DIAGNOSIS — Z0000 Encounter for general adult medical examination without abnormal findings: Secondary | ICD-10-CM | POA: Diagnosis not present

## 2022-11-20 DIAGNOSIS — Z1331 Encounter for screening for depression: Secondary | ICD-10-CM | POA: Diagnosis not present

## 2022-11-21 DIAGNOSIS — F4321 Adjustment disorder with depressed mood: Secondary | ICD-10-CM | POA: Diagnosis not present

## 2022-11-27 DIAGNOSIS — F4321 Adjustment disorder with depressed mood: Secondary | ICD-10-CM | POA: Diagnosis not present

## 2022-12-04 DIAGNOSIS — F4321 Adjustment disorder with depressed mood: Secondary | ICD-10-CM | POA: Diagnosis not present

## 2022-12-11 DIAGNOSIS — F411 Generalized anxiety disorder: Secondary | ICD-10-CM | POA: Diagnosis not present

## 2022-12-18 DIAGNOSIS — F411 Generalized anxiety disorder: Secondary | ICD-10-CM | POA: Diagnosis not present

## 2022-12-25 DIAGNOSIS — F411 Generalized anxiety disorder: Secondary | ICD-10-CM | POA: Diagnosis not present

## 2023-01-01 DIAGNOSIS — F411 Generalized anxiety disorder: Secondary | ICD-10-CM | POA: Diagnosis not present

## 2023-01-08 DIAGNOSIS — F411 Generalized anxiety disorder: Secondary | ICD-10-CM | POA: Diagnosis not present

## 2023-01-15 DIAGNOSIS — F4321 Adjustment disorder with depressed mood: Secondary | ICD-10-CM | POA: Diagnosis not present

## 2023-01-25 DIAGNOSIS — F4321 Adjustment disorder with depressed mood: Secondary | ICD-10-CM | POA: Diagnosis not present

## 2023-01-29 DIAGNOSIS — F4321 Adjustment disorder with depressed mood: Secondary | ICD-10-CM | POA: Diagnosis not present

## 2023-02-05 DIAGNOSIS — F4321 Adjustment disorder with depressed mood: Secondary | ICD-10-CM | POA: Diagnosis not present

## 2023-02-14 DIAGNOSIS — F4321 Adjustment disorder with depressed mood: Secondary | ICD-10-CM | POA: Diagnosis not present

## 2023-02-21 DIAGNOSIS — F4310 Post-traumatic stress disorder, unspecified: Secondary | ICD-10-CM | POA: Diagnosis not present

## 2023-02-28 DIAGNOSIS — F4310 Post-traumatic stress disorder, unspecified: Secondary | ICD-10-CM | POA: Diagnosis not present

## 2023-03-07 DIAGNOSIS — F4321 Adjustment disorder with depressed mood: Secondary | ICD-10-CM | POA: Diagnosis not present

## 2023-03-15 DIAGNOSIS — F4321 Adjustment disorder with depressed mood: Secondary | ICD-10-CM | POA: Diagnosis not present

## 2023-03-21 DIAGNOSIS — F4321 Adjustment disorder with depressed mood: Secondary | ICD-10-CM | POA: Diagnosis not present

## 2023-03-26 DIAGNOSIS — F4321 Adjustment disorder with depressed mood: Secondary | ICD-10-CM | POA: Diagnosis not present

## 2023-04-04 DIAGNOSIS — F4321 Adjustment disorder with depressed mood: Secondary | ICD-10-CM | POA: Diagnosis not present

## 2023-04-11 DIAGNOSIS — F4321 Adjustment disorder with depressed mood: Secondary | ICD-10-CM | POA: Diagnosis not present

## 2023-04-25 DIAGNOSIS — F4321 Adjustment disorder with depressed mood: Secondary | ICD-10-CM | POA: Diagnosis not present

## 2023-05-01 DIAGNOSIS — M25512 Pain in left shoulder: Secondary | ICD-10-CM | POA: Diagnosis not present

## 2023-05-01 DIAGNOSIS — M542 Cervicalgia: Secondary | ICD-10-CM | POA: Diagnosis not present

## 2023-05-02 DIAGNOSIS — F4321 Adjustment disorder with depressed mood: Secondary | ICD-10-CM | POA: Diagnosis not present

## 2023-05-09 DIAGNOSIS — F4321 Adjustment disorder with depressed mood: Secondary | ICD-10-CM | POA: Diagnosis not present

## 2023-05-30 DIAGNOSIS — F4321 Adjustment disorder with depressed mood: Secondary | ICD-10-CM | POA: Diagnosis not present

## 2023-05-31 DIAGNOSIS — J0140 Acute pansinusitis, unspecified: Secondary | ICD-10-CM | POA: Diagnosis not present

## 2023-06-13 DIAGNOSIS — F4321 Adjustment disorder with depressed mood: Secondary | ICD-10-CM | POA: Diagnosis not present

## 2023-06-20 DIAGNOSIS — F4321 Adjustment disorder with depressed mood: Secondary | ICD-10-CM | POA: Diagnosis not present

## 2023-06-27 DIAGNOSIS — F4321 Adjustment disorder with depressed mood: Secondary | ICD-10-CM | POA: Diagnosis not present

## 2023-07-04 DIAGNOSIS — F4321 Adjustment disorder with depressed mood: Secondary | ICD-10-CM | POA: Diagnosis not present

## 2023-07-05 DIAGNOSIS — F319 Bipolar disorder, unspecified: Secondary | ICD-10-CM | POA: Diagnosis not present

## 2023-07-11 DIAGNOSIS — F4321 Adjustment disorder with depressed mood: Secondary | ICD-10-CM | POA: Diagnosis not present

## 2023-07-18 DIAGNOSIS — F4321 Adjustment disorder with depressed mood: Secondary | ICD-10-CM | POA: Diagnosis not present

## 2023-07-25 DIAGNOSIS — F4321 Adjustment disorder with depressed mood: Secondary | ICD-10-CM | POA: Diagnosis not present

## 2023-08-01 DIAGNOSIS — F4321 Adjustment disorder with depressed mood: Secondary | ICD-10-CM | POA: Diagnosis not present

## 2024-02-06 DIAGNOSIS — F4321 Adjustment disorder with depressed mood: Secondary | ICD-10-CM | POA: Diagnosis not present

## 2024-02-13 DIAGNOSIS — F4321 Adjustment disorder with depressed mood: Secondary | ICD-10-CM | POA: Diagnosis not present

## 2024-02-14 DIAGNOSIS — F12250 Cannabis dependence with psychotic disorder with delusions: Secondary | ICD-10-CM | POA: Diagnosis not present

## 2024-02-14 DIAGNOSIS — Z9151 Personal history of suicidal behavior: Secondary | ICD-10-CM | POA: Diagnosis not present

## 2024-02-14 DIAGNOSIS — Z91148 Patient's other noncompliance with medication regimen for other reason: Secondary | ICD-10-CM | POA: Diagnosis not present

## 2024-02-14 DIAGNOSIS — R45851 Suicidal ideations: Secondary | ICD-10-CM | POA: Diagnosis not present

## 2024-02-14 DIAGNOSIS — F319 Bipolar disorder, unspecified: Secondary | ICD-10-CM | POA: Diagnosis not present

## 2024-02-27 DIAGNOSIS — F4321 Adjustment disorder with depressed mood: Secondary | ICD-10-CM | POA: Diagnosis not present

## 2024-03-05 DIAGNOSIS — F19959 Other psychoactive substance use, unspecified with psychoactive substance-induced psychotic disorder, unspecified: Secondary | ICD-10-CM | POA: Diagnosis not present

## 2024-03-05 DIAGNOSIS — F4321 Adjustment disorder with depressed mood: Secondary | ICD-10-CM | POA: Diagnosis not present

## 2024-03-05 DIAGNOSIS — F12959 Cannabis use, unspecified with psychotic disorder, unspecified: Secondary | ICD-10-CM | POA: Diagnosis not present

## 2024-03-12 DIAGNOSIS — F4321 Adjustment disorder with depressed mood: Secondary | ICD-10-CM | POA: Diagnosis not present

## 2024-03-19 DIAGNOSIS — F19959 Other psychoactive substance use, unspecified with psychoactive substance-induced psychotic disorder, unspecified: Secondary | ICD-10-CM | POA: Diagnosis not present

## 2024-03-19 DIAGNOSIS — F12959 Cannabis use, unspecified with psychotic disorder, unspecified: Secondary | ICD-10-CM | POA: Diagnosis not present

## 2024-03-19 DIAGNOSIS — F4321 Adjustment disorder with depressed mood: Secondary | ICD-10-CM | POA: Diagnosis not present

## 2024-04-02 DIAGNOSIS — F4321 Adjustment disorder with depressed mood: Secondary | ICD-10-CM | POA: Diagnosis not present

## 2024-04-02 DIAGNOSIS — F1220 Cannabis dependence, uncomplicated: Secondary | ICD-10-CM | POA: Diagnosis not present

## 2024-04-08 ENCOUNTER — Emergency Department: Admission: EM | Admit: 2024-04-08 | Discharge: 2024-04-08 | Disposition: A | Discharge status: Home / Self Care

## 2024-04-08 ENCOUNTER — Other Ambulatory Visit: Payer: Self-pay

## 2024-04-08 DIAGNOSIS — G4700 Insomnia, unspecified: Secondary | ICD-10-CM | POA: Insufficient documentation

## 2024-04-08 DIAGNOSIS — F419 Anxiety disorder, unspecified: Secondary | ICD-10-CM | POA: Insufficient documentation

## 2024-04-08 MED ORDER — LORAZEPAM 0.5 MG PO TABS: 0.5000 mg | ORAL_TABLET | Freq: Once | ORAL | Status: DC

## 2024-04-08 MED ORDER — HYDROXYZINE HCL 25 MG PO TABS
25.0000 mg | ORAL_TABLET | Freq: Once | ORAL | Status: AC
  Administered 2024-04-08: 25 mg via ORAL
  Filled 2024-04-08: qty 1

## 2024-04-08 MED ORDER — HYDROXYZINE HCL 10 MG PO TABS
10.0000 mg | ORAL_TABLET | Freq: Three times a day (TID) | ORAL | 0 refills | Status: AC | PRN
Start: 2024-04-08 — End: ?

## 2024-04-08 MED ORDER — HYDROXYZINE HCL 10 MG PO TABS: 10.0000 mg | ORAL_TABLET | Freq: Once | ORAL | Status: DC

## 2024-04-08 NOTE — Discharge Instructions (Addendum)
 Your evaluation in the emergency department was overall reassuring.  We did give you anxiety medication to help with your increased anxiety, I suspect this will also help with your insomnia as well.  Please follow-up with your psychiatrist at 10 AM as already planned.  Return to the emergency department with any new or worsening symptoms.

## 2024-04-08 NOTE — ED Provider Notes (Signed)
 [facility] Provider Note    Event Date/Time   First MD Initiated Contact with Patient 04/08/24 0153     (approximate)   History   Insomnia  Pt reports since Saturday he hasn't been able to get any sleep, pt reports he takes Prozac and Lamictal as prescribed. Pt calm and cooperative. Pt states he was concerned if he was unable to sleep he would go into a manic state. Pt denies SI   HPI Ricky Benson is a 28 y.o. male PMH bipolar 1 disorder presents for evaluation of insomnia -Patient states he has not been able to sleep for about 2-3 nights, presents to emergency department for evaluation -He is taking all of his medications as prescribed.  Denies SI, HI, hallucinations. - Took 10 mg of melatonin with no relief -Has a follow-up appointment with his psychiatrist at 10 AM -Also notes he is feeling very anxious recently.  No substance use, had previously been using marijuana though says he stopped this in August and has been having some issues with insomnia since then. -Denies any physical complaints     Physical Exam   Triage Vital Signs: ED Triage Vitals  Encounter Vitals Group     BP 04/08/24 0150 (!) 137/91     Girls Systolic BP Percentile --      Girls Diastolic BP Percentile --      Boys Systolic BP Percentile --      Boys Diastolic BP Percentile --      Pulse Rate 04/08/24 0150 72     Resp 04/08/24 0150 19     Temp 04/08/24 0150 98.1 F (36.7 C)     Temp src --      SpO2 04/08/24 0150 100 %     Weight 04/08/24 0149 205 lb (93 kg)     Height 04/08/24 0149 6' (1.829 m)     Head Circumference --      Peak Flow --      Pain Score 04/08/24 0149 3     Pain Loc --      Pain Education --      Exclude from Growth Chart --     Most recent vital signs: Vitals:   04/08/24 0150  BP: (!) 137/91  Pulse: 72  Resp: 19  Temp: 98.1 F (36.7 C)  SpO2: 100%     General: Awake, no distress.  CV:  Warm, well-perfused Resp:  Normal  effort Abd:  No distention. Nontender to deep palpation throughout Psych:  Mildly anxious, denies SI, HI, hallucinations.  Linear.  Pleasant and cooperative.   ED Results / Procedures / Treatments   Labs (all labs ordered are listed, but only abnormal results are displayed) Labs Reviewed - No data to display   EKG  N/a   RADIOLOGY N/a    PROCEDURES:  Critical Care performed: No  Procedures   MEDICATIONS ORDERED IN ED: Medications  hydrOXYzine (ATARAX) tablet 25 mg (has no administration in time range)     IMPRESSION / MDM / ASSESSMENT AND PLAN / ED COURSE  I reviewed the triage vital signs and the nursing notes.                              DDX/MDM/AP: Differential diagnosis includes, but is not limited to, insomnia, no evidence of decompensated psychiatric disorder at this time.  No clinical concern for underlying organic etiology.  Denies any substance use  may.  Suspect anxiety likely contributing.  Plan: - Hydroxyzine - Mother will drive him home - Avoid caffeine - Follow-up with psychiatry tomorrow  Patient's presentation is most consistent with acute, uncomplicated illness.   ED course below.  Rx hydroxyzine for anxiety use as needed.  Has psychiatry appointment tomorrow.  Continue melatonin as needed.  Avoid caffeine.  ED return precautions in place.  Patient agrees with plan.      FINAL CLINICAL IMPRESSION(S) / ED DIAGNOSES   Final diagnoses:  Insomnia, unspecified type  Anxiety     Rx / DC Orders   ED Discharge Orders          Ordered    hydrOXYzine (ATARAX) 10 MG tablet  3 times daily PRN        04/08/24 0246             Note:  This document was prepared using Dragon voice recognition software and may include unintentional dictation errors.   Clarine Ozell LABOR, MD 04/08/24 [phone]

## 2024-04-08 NOTE — ED Triage Notes (Signed)
 Pt reports since Saturday he hasn't been able to get any sleep, pt reports he takes Prozac and Lamictal as prescribed. Pt calm and cooperative. Pt states he was concerned if he was unable to sleep he would go into a manic state. Pt denies SI

## 2024-04-09 DIAGNOSIS — F4321 Adjustment disorder with depressed mood: Secondary | ICD-10-CM | POA: Diagnosis not present

## 2024-04-14 DIAGNOSIS — F19959 Other psychoactive substance use, unspecified with psychoactive substance-induced psychotic disorder, unspecified: Secondary | ICD-10-CM | POA: Diagnosis not present

## 2024-04-14 DIAGNOSIS — F1220 Cannabis dependence, uncomplicated: Secondary | ICD-10-CM | POA: Diagnosis not present

## 2024-04-18 DIAGNOSIS — S96911A Strain of unspecified muscle and tendon at ankle and foot level, right foot, initial encounter: Secondary | ICD-10-CM | POA: Diagnosis not present

## 2024-04-23 DIAGNOSIS — F411 Generalized anxiety disorder: Secondary | ICD-10-CM | POA: Diagnosis not present

## 2024-04-28 DIAGNOSIS — F1220 Cannabis dependence, uncomplicated: Secondary | ICD-10-CM | POA: Diagnosis not present

## 2024-04-28 DIAGNOSIS — F411 Generalized anxiety disorder: Secondary | ICD-10-CM | POA: Diagnosis not present

## 2024-04-30 DIAGNOSIS — F4321 Adjustment disorder with depressed mood: Secondary | ICD-10-CM | POA: Diagnosis not present

## 2024-05-05 DIAGNOSIS — F1220 Cannabis dependence, uncomplicated: Secondary | ICD-10-CM | POA: Diagnosis not present

## 2024-05-05 DIAGNOSIS — F1420 Cocaine dependence, uncomplicated: Secondary | ICD-10-CM | POA: Diagnosis not present

## 2024-05-05 DIAGNOSIS — F1020 Alcohol dependence, uncomplicated: Secondary | ICD-10-CM | POA: Diagnosis not present

## 2024-05-07 DIAGNOSIS — F1420 Cocaine dependence, uncomplicated: Secondary | ICD-10-CM | POA: Diagnosis not present

## 2024-05-07 DIAGNOSIS — F1220 Cannabis dependence, uncomplicated: Secondary | ICD-10-CM | POA: Diagnosis not present

## 2024-05-07 DIAGNOSIS — F1020 Alcohol dependence, uncomplicated: Secondary | ICD-10-CM | POA: Diagnosis not present

## 2024-05-07 DIAGNOSIS — F4321 Adjustment disorder with depressed mood: Secondary | ICD-10-CM | POA: Diagnosis not present

## 2024-05-13 DIAGNOSIS — S96911A Strain of unspecified muscle and tendon at ankle and foot level, right foot, initial encounter: Secondary | ICD-10-CM | POA: Diagnosis not present
